# Patient Record
Sex: Female | Born: 1985 | Race: White | Hispanic: No | Marital: Single | Smoking: Current every day smoker
Health system: Southern US, Community
[De-identification: ages and names within clinical notes are randomized; demographics above are authoritative.]

## PROBLEM LIST (undated history)

## (undated) DIAGNOSIS — S42309A Unspecified fracture of shaft of humerus, unspecified arm, initial encounter for closed fracture: Secondary | ICD-10-CM

## (undated) DIAGNOSIS — R569 Unspecified convulsions: Secondary | ICD-10-CM

## (undated) DIAGNOSIS — F329 Major depressive disorder, single episode, unspecified: Secondary | ICD-10-CM

## (undated) DIAGNOSIS — F32A Depression, unspecified: Secondary | ICD-10-CM

## (undated) DIAGNOSIS — Z8489 Family history of other specified conditions: Secondary | ICD-10-CM

## (undated) DIAGNOSIS — O24419 Gestational diabetes mellitus in pregnancy, unspecified control: Secondary | ICD-10-CM

## (undated) HISTORY — PX: WISDOM TOOTH EXTRACTION: SHX21

## (undated) HISTORY — PX: SHOULDER SURGERY: SHX246

## (undated) HISTORY — DX: Gestational diabetes mellitus in pregnancy, unspecified control: O24.419

---
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
## 2008-10-05 ENCOUNTER — Emergency Department (HOSPITAL_COMMUNITY): Admission: EM | Admit: 2008-10-05 | Discharge: 2008-10-05 | Payer: Self-pay | Admitting: Family Medicine

## 2009-01-05 ENCOUNTER — Emergency Department (HOSPITAL_COMMUNITY): Admission: EM | Admit: 2009-01-05 | Discharge: 2009-01-05 | Payer: Self-pay | Admitting: Emergency Medicine

## 2009-07-14 ENCOUNTER — Inpatient Hospital Stay (HOSPITAL_COMMUNITY): Admission: AD | Admit: 2009-07-14 | Discharge: 2009-07-14 | Payer: Self-pay | Admitting: Family Medicine

## 2010-07-10 LAB — DIFFERENTIAL
Basophils Absolute: 0 10*3/uL (ref 0.0–0.1)
Basophils Relative: 0 % (ref 0–1)
Eosinophils Absolute: 0.3 10*3/uL (ref 0.0–0.7)
Lymphs Abs: 1.8 10*3/uL (ref 0.7–4.0)
Monocytes Absolute: 0.4 10*3/uL (ref 0.1–1.0)
Monocytes Relative: 5 % (ref 3–12)
Neutro Abs: 4.7 10*3/uL (ref 1.7–7.7)
Neutrophils Relative %: 65 % (ref 43–77)

## 2010-07-10 LAB — WET PREP, GENITAL: Yeast Wet Prep HPF POC: NONE SEEN

## 2010-07-10 LAB — URINALYSIS, ROUTINE W REFLEX MICROSCOPIC
Bilirubin Urine: NEGATIVE
Nitrite: NEGATIVE
pH: 8 (ref 5.0–8.0)

## 2010-07-10 LAB — CBC
MCHC: 32.9 g/dL (ref 30.0–36.0)
RBC: 5.08 MIL/uL (ref 3.87–5.11)

## 2010-07-24 LAB — POCT URINALYSIS DIP (DEVICE)
Nitrite: NEGATIVE
Specific Gravity, Urine: >=1.03 (ref 1.005–1.030)

## 2011-01-26 LAB — DIFFERENTIAL
Basophils Absolute: 0
Basophils Relative: 1
Eosinophils Absolute: 0.4
Lymphocytes Relative: 25
Neutrophils Relative %: 63

## 2011-01-26 LAB — CBC
RBC: 5.23 — ABNORMAL HIGH
WBC: 8.4

## 2011-01-26 LAB — POCT PREGNANCY, URINE: Preg Test, Ur: NEGATIVE

## 2011-01-26 LAB — POCT URINALYSIS DIP (DEVICE)
Glucose, UA: NEGATIVE
Ketones, ur: NEGATIVE
Operator id: 235561

## 2011-02-01 LAB — POCT RAPID STREP A: Streptococcus, Group A Screen (Direct): NEGATIVE

## 2011-02-01 LAB — POCT INFECTIOUS MONO SCREEN: Mono Screen: NEGATIVE

## 2011-05-23 ENCOUNTER — Emergency Department (HOSPITAL_COMMUNITY)
Admission: EM | Admit: 2011-05-23 | Discharge: 2011-05-23 | Disposition: A | Discharge status: Home / Self Care | Payer: Self-pay | Attending: Emergency Medicine | Admitting: Emergency Medicine

## 2011-05-23 ENCOUNTER — Encounter (HOSPITAL_COMMUNITY): Payer: Self-pay | Admitting: *Deleted

## 2011-05-23 DIAGNOSIS — M542 Cervicalgia: Secondary | ICD-10-CM | POA: Insufficient documentation

## 2011-05-23 DIAGNOSIS — H60399 Other infective otitis externa, unspecified ear: Secondary | ICD-10-CM | POA: Insufficient documentation

## 2011-05-23 DIAGNOSIS — H9209 Otalgia, unspecified ear: Secondary | ICD-10-CM | POA: Insufficient documentation

## 2011-05-23 DIAGNOSIS — H6000 Abscess of external ear, unspecified ear: Secondary | ICD-10-CM

## 2011-05-23 DIAGNOSIS — R6884 Jaw pain: Secondary | ICD-10-CM | POA: Insufficient documentation

## 2011-05-23 MED ORDER — HYDROMORPHONE HCL PF 1 MG/ML IJ SOLN
1.0000 mg | Freq: Once | INTRAMUSCULAR | Status: AC
  Administered 2011-05-23: 1 mg via INTRAMUSCULAR
  Filled 2011-05-23: qty 1

## 2011-05-23 MED ORDER — ONDANSETRON 4 MG PO TBDP
ORAL_TABLET | ORAL | Status: AC
  Administered 2011-05-23: 8 mg
  Filled 2011-05-23: qty 2

## 2011-05-23 MED ORDER — HYDROCODONE-ACETAMINOPHEN 5-325 MG PO TABS: 1.0000 | ORAL_TABLET | Freq: Four times a day (QID) | ORAL | Status: AC | PRN

## 2011-05-23 MED ORDER — DOXYCYCLINE HYCLATE 100 MG PO CAPS: 100.0000 mg | ORAL_CAPSULE | Freq: Two times a day (BID) | ORAL | Status: AC

## 2011-05-23 MED ORDER — DIAZEPAM 5 MG PO TABS
5.0000 mg | ORAL_TABLET | Freq: Once | ORAL | Status: AC
  Administered 2011-05-23: 5 mg via ORAL
  Filled 2011-05-23: qty 1

## 2011-05-23 NOTE — ED Provider Notes (Signed)
History     CSN: [id number]  Arrival date & time 05/23/11  0910   First MD Initiated Contact with Patient 05/23/11 [phone]      Chief Complaint  Patient presents with  . Otalgia    (Consider location/radiation/quality/duration/timing/severity/associated sxs/prior treatment) HPI Patient states that she has been having right ear pain for about 1 week. The pain is located on the posterior side of her ear lobe and has progressively gotten worse. She rates her pain "20/10". She states that it radiates into her jaw and neck on the right side. She has used ice packs and ibuprofen with little relief of pain. 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 She says that these have been there for years and never caused her any problems. She denies fevers, chills, sweats, and ear drainage. She denies tooth pain and nasal congestion. Patient is in tears during the interview and is using a heat pack. The patient currently takes no medications regularly. She is allergic to Depakote. History reviewed. No pertinent past medical history.  History reviewed. No pertinent past surgical history.  History reviewed. No pertinent family history.  History  Substance Use Topics  . Smoking status: Current Everyday Smoker  . Smokeless tobacco: Not on file  . Alcohol Use: Yes    OB History    Grav Para Term Preterm Abortions TAB SAB Ect Mult Living                  Review of Systems All pertinent positives and negatives reviewed in the history of present illness   Allergies  Depakote  Home Medications   Current Outpatient Rx  Name Route Sig Dispense Refill  . HYDROCOD POLST-CPM POLST ER 10-8 MG/5ML PO LQCR Oral Take 5 mLs by mouth every 12 (twelve) hours as needed. For cough    . IBUPROFEN 200 MG PO TABS Oral Take 800 mg by mouth every 8 (eight) hours as needed. For pain      BP 128/77  Pulse 74  Temp(Src) 98.1 F (36.7 C) (Oral)  Resp 17  SpO2  100%  Physical Exam  Constitutional: She appears well-developed and well-nourished. She appears distressed.  HENT:  Head: Normocephalic and atraumatic.  Left Ear: External ear normal.  Mouth/Throat: Oropharynx is clear and moist. No oropharyngeal exudate.       Rt. Ear: 1cm abcess located on posterior lobe of right ear; mild erythema to external ear/mastoid posteriorly; painful to palpation; TM nl.   Eyes: Conjunctivae and EOM are normal. Pupils are equal, round, and reactive to light.  Neck: Neck supple.  Lymphadenopathy:    She has no cervical adenopathy.  Skin: She is not diaphoretic.    ED Course  Procedures (including critical care time)   INCISION AND DRAINAGE Performed by: Carlyle Dolly Consent: Verbal consent obtained. Risks and benefits: risks, benefits and alternatives were discussed Type: abscess  Body area: R ear lobe posteriorly  Anesthesia: local infiltration  Local anesthetic: lidocaine 2% wo epinephrine  Anesthetic total: 5 ml  Complexity: complex Blunt dissection   Drainage: purulent  Drainage amount:large  Packing material: 1/4 in iodoform gauze  Patient tolerance: Patient tolerated the procedure well with no immediate complications.          MDM  The patient is advised to return here for any worsening in her condition and is asked to return here in 2 days for a recheck. Told to use warm compresses on  the area.        Carlyle Dolly, PA-C 05/23/11 1240

## 2011-05-23 NOTE — ED Notes (Signed)
Per PA, band aid applied to pt's ear.

## 2011-05-23 NOTE — ED Notes (Signed)
Pt reports 3 day hx of progressively worsening right ear swelling. Pt with redness and swelling to lower lobe of ear. Pt reports very painful. No draining noted.

## 2011-05-24 NOTE — ED Provider Notes (Signed)
Medical screening examination/treatment/procedure(s) were performed by non-physician practitioner and as supervising physician I was immediately available for consultation/collaboration.   Shelda Jakes, MD 05/24/11 1240

## 2011-05-25 ENCOUNTER — Emergency Department (HOSPITAL_COMMUNITY)
Admission: EM | Admit: 2011-05-25 | Discharge: 2011-05-25 | Disposition: A | Discharge status: Home / Self Care | Payer: Self-pay | Attending: Emergency Medicine | Admitting: Emergency Medicine

## 2011-05-25 ENCOUNTER — Encounter (HOSPITAL_COMMUNITY): Payer: Self-pay | Admitting: Emergency Medicine

## 2011-05-25 DIAGNOSIS — H60399 Other infective otitis externa, unspecified ear: Secondary | ICD-10-CM | POA: Insufficient documentation

## 2011-05-25 DIAGNOSIS — Z09 Encounter for follow-up examination after completed treatment for conditions other than malignant neoplasm: Secondary | ICD-10-CM | POA: Insufficient documentation

## 2011-05-25 DIAGNOSIS — Z4800 Encounter for change or removal of nonsurgical wound dressing: Secondary | ICD-10-CM | POA: Insufficient documentation

## 2011-05-25 DIAGNOSIS — H6001 Abscess of right external ear: Secondary | ICD-10-CM

## 2011-05-25 NOTE — ED Notes (Signed)
Abscess to Rt. Ear lobe continues to be red, swollen and dressing intact. Improvement noted and pain has improved

## 2011-05-25 NOTE — ED Provider Notes (Signed)
Medical screening examination/treatment/procedure(s) were performed by non-physician practitioner and as supervising physician I was immediately available for consultation/collaboration.   Loren Racer, MD 05/25/11 2002

## 2011-05-25 NOTE — ED Notes (Signed)
Pt here for recheck of abscess on right ear

## 2011-05-25 NOTE — ED Provider Notes (Signed)
History     CSN: [id number]  Arrival date & time 05/25/11  1342   First MD Initiated Contact with Patient 05/25/11 1421      Chief Complaint  Patient presents with  . Wound Check    (Consider location/radiation/quality/duration/timing/severity/associated sxs/prior treatment) HPI  26 year old female is in CDU for a wound recheck. Patient has an abscess to the right earlobe that was successfully behind the 2 days ago. Packing was put in place. Patient was discharge with pain medication and antibiotic. Patient states her pain has decreased but is still moderately tender. She denies fever. She denies seeing red streak, or change in her hearing.  History reviewed. No pertinent past medical history.  History reviewed. No pertinent past surgical history.  History reviewed. No pertinent family history.  History  Substance Use Topics  . Smoking status: Current Everyday Smoker  . Smokeless tobacco: Not on file  . Alcohol Use: Yes    OB History    Grav Para Term Preterm Abortions TAB SAB Ect Mult Living                  Review of Systems  All other systems reviewed and are negative.    Allergies  Depakote  Home Medications   Current Outpatient Rx  Name Route Sig Dispense Refill  . DOXYCYCLINE HYCLATE 100 MG PO CAPS Oral Take 1 capsule (100 mg total) by mouth 2 (two) times daily. 20 capsule 0  . HYDROCODONE-ACETAMINOPHEN 5-325 MG PO TABS Oral Take 1 tablet by mouth every 6 (six) hours as needed for pain. 15 tablet 0  . IBUPROFEN 200 MG PO TABS Oral Take 800 mg by mouth every 8 (eight) hours as needed. For pain      BP 116/73  Pulse 78  Temp 97.8 F (36.6 C)  Resp 18  SpO2 100%  Physical Exam  Constitutional: She appears well-developed and well-nourished. No distress.  HENT:  Head: Atraumatic.  Left Ear: External ear normal.       Right ear:  Tenderness and fluctuant to lower your lobe with packing in place. No erythema noted. No pre-or postauricular  lymphadenopathy. Right ear canal is unremarkable.  Eyes: Conjunctivae are normal.  Neck: Normal range of motion. Neck supple.  Pulmonary/Chest: Effort normal.  Neurological: She is alert.  Skin: Skin is warm.    ED Course  Procedures (including critical care time)  Labs Reviewed - No data to display No results found.   No diagnosis found.    MDM  Packing from right ear were removed. Moderate pustular exudates were noted. I proceeds to continue manually expelled the remainder of the pustular discharge. Abscess area was irrigated with normal saline. Patient tolerated the procedure well. Bacitracin applied and area was bandaged. Patient is recommended to continue antibiotic and pain medication. Wound care instruction given.        Fayrene Helper, PA-C 05/25/11 1437

## 2012-05-02 ENCOUNTER — Emergency Department (HOSPITAL_COMMUNITY): Admission: EM | Admit: 2012-05-02 | Discharge: 2012-05-02 | Payer: Self-pay | Source: Home / Self Care

## 2012-05-02 NOTE — ED Notes (Signed)
Called in all waiting areas - no response. 

## 2012-09-01 ENCOUNTER — Emergency Department (INDEPENDENT_AMBULATORY_CARE_PROVIDER_SITE_OTHER)
Admission: EM | Admit: 2012-09-01 | Discharge: 2012-09-01 | Disposition: A | Discharge status: Home / Self Care | Payer: Self-pay | Source: Home / Self Care | Attending: Family Medicine | Admitting: Family Medicine

## 2012-09-01 ENCOUNTER — Encounter (HOSPITAL_COMMUNITY): Payer: Self-pay | Admitting: Emergency Medicine

## 2012-09-01 DIAGNOSIS — L089 Local infection of the skin and subcutaneous tissue, unspecified: Secondary | ICD-10-CM

## 2012-09-01 DIAGNOSIS — L723 Sebaceous cyst: Secondary | ICD-10-CM

## 2012-09-01 LAB — POCT URINALYSIS DIP (DEVICE)
Glucose, UA: NEGATIVE mg/dL
Hgb urine dipstick: NEGATIVE
Nitrite: NEGATIVE
Urobilinogen, UA: 0.2 mg/dL (ref 0.0–1.0)

## 2012-09-01 LAB — POCT PREGNANCY, URINE: Preg Test, Ur: NEGATIVE

## 2012-09-01 LAB — GLUCOSE, CAPILLARY: Glucose-Capillary: 112 mg/dL — ABNORMAL HIGH (ref 70–99)

## 2012-09-01 MED ORDER — IBUPROFEN 600 MG PO TABS: 600.0000 mg | ORAL_TABLET | Freq: Three times a day (TID) | ORAL | Status: DC | PRN

## 2012-09-01 MED ORDER — DOXYCYCLINE HYCLATE 100 MG PO CAPS: 100.0000 mg | ORAL_CAPSULE | Freq: Two times a day (BID) | ORAL | Status: DC

## 2012-09-01 NOTE — ED Provider Notes (Signed)
History     CSN: [id number]  Arrival date & time 09/01/12  1223   First MD Initiated Contact with Patient 09/01/12 1319      Chief Complaint  Patient presents with  . Abscess  . Dizziness    (Consider location/radiation/quality/duration/timing/severity/associated sxs/prior treatment) HPI Comments: 27 year old nondiabetic female here with the following complaints: #1) a full swelling not in  the left side of her face anterior to her ear. She has felt a knot for several weeks she has been dropping ovary and became tender and swollen few days ago (about 3 days) 0 no spontaneous drainage. No fever or chills. No ear drainage.  #2) patient complaining of intermittent dizziness, urinary frequency and drinking more fluids than what is usual for her she understand these are symptoms of diabetes and she has strong family history of diabetes. She has never been diagnosed with diabetes in the past. Denies painful urination fever or chills. No nausea or vomiting. No pelvic pain. No vaginal discharge. Patient reports using condoms consistently for birth control.    History reviewed. No pertinent past medical history.  History reviewed. No pertinent past surgical history.  No family history on file.  History  Substance Use Topics  . Smoking status: Current Every Day Smoker  . Smokeless tobacco: Not on file  . Alcohol Use: Yes    OB History   Grav Para Term Preterm Abortions TAB SAB Ect Mult Living                  Review of Systems  Constitutional: Negative for fever and chills.  HENT: Negative for hearing loss, ear pain, facial swelling and ear discharge.   Eyes: Negative for visual disturbance.  Respiratory: Negative for shortness of breath.   Endocrine: Positive for polydipsia and polyuria. Negative for cold intolerance and heat intolerance.  Genitourinary: Negative for dysuria and flank pain.  Skin:       As per HPI  Neurological: Negative for facial asymmetry and headaches.   Psychiatric/Behavioral: The patient is nervous/anxious.   All other systems reviewed and are negative.    Allergies  Divalproex sodium  Home Medications   Current Outpatient Rx  Name  Route  Sig  Dispense  Refill  . doxycycline (VIBRAMYCIN) 100 MG capsule   Oral   Take 1 capsule (100 mg total) by mouth 2 (two) times daily.   20 capsule   0   . ibuprofen (ADVIL,MOTRIN) 600 MG tablet   Oral   Take 1 tablet (600 mg total) by mouth every 8 (eight) hours as needed for pain.   30 tablet   0     BP 139/95  Pulse 80  Temp(Src) 98.7 F (37.1 C) (Oral)  Resp 16  SpO2 100%  LMP 08/24/2012  Physical Exam  Nursing note and vitals reviewed. Constitutional: She is oriented to person, place, and time. She appears well-developed and well-nourished. No distress.  HENT:  Head: Normocephalic and atraumatic.  Right Ear: External ear normal.  Left Ear: External ear normal.  Mouth/Throat: Oropharynx is clear and moist. No oropharyngeal exudate.  Neck: Neck supple.  Cardiovascular: Normal heart sounds.   Pulmonary/Chest: Breath sounds normal.  Lymphadenopathy:    She has no cervical adenopathy.  Neurological: She is oriented to person, place, and time.  Skin: She is not diaphoretic.  Face: indurated lesion about 1x2 cm with central fluctuation and mild erythema on  Top located anterior to left ear tragus. No cellulitis associated.     ED  Course  INCISION AND DRAINAGE Performed by: Sharin Grave Authorized by: Sharin Grave Risks and benefits: risks, benefits and alternatives were discussed Consent given by: patient Patient understanding: patient states understanding of the procedure being performed Patient consent: the patient's understanding of the procedure matches consent given Type: abscess (infected epidermacys) Body area: head/neck Location details: face Local anesthetic: lidocaine 1% with epinephrine Anesthetic total: 1 ml Patient sedated: no Scalpel size:  11 Incision type: single straight Complexity: simple Drainage: purulent and serous Drainage amount: moderate Patient tolerance: Patient tolerated the procedure well with no immediate complications.   (including critical care time)  Labs Reviewed  GLUCOSE, CAPILLARY - Abnormal; Notable for the following:    Glucose-Capillary 112 (*)    All other components within normal limits  CULTURE, ROUTINE-ABSCESS  POCT URINALYSIS DIP (DEVICE)  POCT PREGNANCY, URINE   No results found.   1. Infected epithelial inclusion cyst       MDM  Epidermal cyst infected. Status post incision and drainage today. Samples sent for culture Treated with doxycycline and ibuprofen. CBG random 112. No glucosuria normal urinalysis and negative pregnancy test. Impress anxiety component. Supportive care and red flags that should prompt her return to medical attention discussed with patient and provided in writing.        Sharin Grave, MD 09/01/12 1455

## 2012-09-01 NOTE — ED Notes (Signed)
Pt c/o abscess on left ear onset 3 weeks; denies drainage Denies: f/v/d Also c/o light headedness onset 2 months; sx include: urinary frequency, freq thrist Family hx of DM  She is alert and oriented w/no signs of acute distress.

## 2012-09-04 LAB — CULTURE, ROUTINE-ABSCESS: Gram Stain: NONE SEEN

## 2013-02-16 ENCOUNTER — Emergency Department (HOSPITAL_COMMUNITY): Payer: Worker's Compensation

## 2013-02-16 ENCOUNTER — Emergency Department (HOSPITAL_COMMUNITY)
Admission: EM | Admit: 2013-02-16 | Discharge: 2013-02-16 | Disposition: A | Discharge status: Home / Self Care | Payer: Worker's Compensation | Attending: Emergency Medicine | Admitting: Emergency Medicine

## 2013-02-16 ENCOUNTER — Encounter (HOSPITAL_COMMUNITY): Payer: Self-pay | Admitting: Emergency Medicine

## 2013-02-16 DIAGNOSIS — R112 Nausea with vomiting, unspecified: Secondary | ICD-10-CM | POA: Insufficient documentation

## 2013-02-16 DIAGNOSIS — W3189XA Contact with other specified machinery, initial encounter: Secondary | ICD-10-CM | POA: Insufficient documentation

## 2013-02-16 DIAGNOSIS — S42309A Unspecified fracture of shaft of humerus, unspecified arm, initial encounter for closed fracture: Secondary | ICD-10-CM | POA: Insufficient documentation

## 2013-02-16 DIAGNOSIS — Y9289 Other specified places as the place of occurrence of the external cause: Secondary | ICD-10-CM | POA: Insufficient documentation

## 2013-02-16 DIAGNOSIS — S0993XA Unspecified injury of face, initial encounter: Secondary | ICD-10-CM | POA: Insufficient documentation

## 2013-02-16 DIAGNOSIS — Y9389 Activity, other specified: Secondary | ICD-10-CM | POA: Insufficient documentation

## 2013-02-16 DIAGNOSIS — S42322A Displaced transverse fracture of shaft of humerus, left arm, initial encounter for closed fracture: Secondary | ICD-10-CM

## 2013-02-16 DIAGNOSIS — IMO0002 Reserved for concepts with insufficient information to code with codable children: Secondary | ICD-10-CM | POA: Insufficient documentation

## 2013-02-16 DIAGNOSIS — S0003XA Contusion of scalp, initial encounter: Secondary | ICD-10-CM | POA: Insufficient documentation

## 2013-02-16 DIAGNOSIS — Y990 Civilian activity done for income or pay: Secondary | ICD-10-CM | POA: Insufficient documentation

## 2013-02-16 DIAGNOSIS — F17200 Nicotine dependence, unspecified, uncomplicated: Secondary | ICD-10-CM | POA: Insufficient documentation

## 2013-02-16 MED ORDER — OXYCODONE-ACETAMINOPHEN 5-325 MG PO TABS: ORAL_TABLET | ORAL | Status: DC

## 2013-02-16 MED ORDER — HYDROMORPHONE HCL PF 1 MG/ML IJ SOLN
0.5000 mg | Freq: Once | INTRAMUSCULAR | Status: AC
  Administered 2013-02-16: 0.5 mg via INTRAVENOUS
  Filled 2013-02-16: qty 1

## 2013-02-16 MED ORDER — HYDROMORPHONE HCL PF 1 MG/ML IJ SOLN
1.0000 mg | Freq: Once | INTRAMUSCULAR | Status: AC
  Administered 2013-02-16: 1 mg via INTRAVENOUS
  Filled 2013-02-16: qty 1

## 2013-02-16 MED ORDER — MORPHINE SULFATE 4 MG/ML IJ SOLN
4.0000 mg | Freq: Once | INTRAMUSCULAR | Status: AC
  Administered 2013-02-16: 4 mg via INTRAVENOUS
  Filled 2013-02-16: qty 1

## 2013-02-16 MED ORDER — ONDANSETRON HCL 4 MG/2ML IJ SOLN
4.0000 mg | Freq: Once | INTRAMUSCULAR | Status: AC
  Administered 2013-02-16: 4 mg via INTRAVENOUS
  Filled 2013-02-16: qty 2

## 2013-02-16 MED ORDER — PROMETHAZINE HCL 25 MG PO TABS: 25.0000 mg | ORAL_TABLET | Freq: Four times a day (QID) | ORAL | Status: DC | PRN

## 2013-02-16 NOTE — ED Provider Notes (Signed)
CSN: [id number]     Arrival date & time 02/16/13  4098 History   First MD Initiated Contact with Patient 02/16/13 1010     Chief Complaint  Patient presents with  . Shoulder Injury  . Neck Injury  . Arm Injury   (Consider location/radiation/quality/duration/timing/severity/associated sxs/prior Treatment) HPI Pt is a 27yo female sent to ED from [facility] after being evaluated for a work related injury.  Pt states she was working with a machine earlier today that [place] used to wind up aluminum onto large pipes when her glove became caught, causing her arm to become stuck in the machine, twisting it around, causing the pt's body to wrap around the maching and land upside down by her neck, lodged between the machine.  Mother saw incident, states pt did not lose consciousness.  Pt c/o severe, 10/10, constant left arm pain that is aching, sharp and throbbing, radiating up and down her left arm, associated with left hand numbness and pain.  Pain is so severe she does not want to move her arm.  Pt was given nubain 10mg  IM at UC and placed in a sling.  Pain medication made pt sleepy and caused her to vomit.  Pt also c/o neck and lower back pain. Denies numbness and tingling in groin or legs. Able to ambulate w/o difficulty. Denies headache, change in vision or balance. Denies chest or abdominal pain. Denies difficulty breathing.  History reviewed. No pertinent past medical history. History reviewed. No pertinent past surgical history. No family history on file. History  Substance Use Topics  . Smoking status: Current Every Day Smoker  . Smokeless tobacco: Not on file  . Alcohol Use: Yes   OB History   Grav Para Term Preterm Abortions TAB SAB Ect Mult Living                 Review of Systems  Gastrointestinal: Positive for nausea and vomiting. Negative for abdominal pain.  Musculoskeletal: Positive for back pain, myalgias and neck pain. Negative for gait problem and neck stiffness.  Neurological:  Positive for weakness ( left hand) and numbness ( left hand). Negative for dizziness, light-headedness and headaches.  All other systems reviewed and are negative.    Allergies  Divalproex sodium  Home Medications   Current Outpatient Rx  Name  Route  Sig  Dispense  Refill  . ALPRAZolam (XANAX) 0.25 MG tablet   Oral   Take 0.25 mg by mouth at bedtime as needed for sleep.         Marland Kitchen oxyCODONE-acetaminophen (PERCOCET/ROXICET) 5-325 MG per tablet      Take 1-2 pills every 4-6 hours as needed for pain.   15 tablet   0   . promethazine (PHENERGAN) 25 MG tablet   Oral   Take 1 tablet (25 mg total) by mouth every 6 (six) hours as needed for nausea.   12 tablet   0    BP 111/72  Pulse 66  Temp(Src) 98.6 F (37 C) (Oral)  Resp 16  Ht 4\' 11"  (1.499 m)  Wt 150 lb (68.04 kg)  BMI 30.28 kg/m2  SpO2 97%  LMP 02/13/2013 Physical Exam  Nursing note and vitals reviewed. Constitutional: She is oriented to person, place, and time. She appears well-developed and well-nourished. No distress.  HENT:  Head: Normocephalic.    Nose: No sinus tenderness or nasal deformity.  Mouth/Throat: Uvula is midline, oropharynx is clear and moist and mucous membranes are normal. No trismus in the jaw.  No oropharyngeal exudate.  Mild ecchymosis over left mandible. TTP. Able to open and close mouth, mild pain. No deformity. No crepitus at TMJ.  Eyes: Conjunctivae and EOM are normal. Pupils are equal, round, and reactive to light. Right eye exhibits no discharge. Left eye exhibits no discharge. No scleral icterus.  Neck: Normal range of motion. Neck supple.  No midline bone tenderness, no crepitus or step-offs. Tender along cervical paraspinal muscles. FROM with mild tenderness.  Cardiovascular: Normal rate, regular rhythm and normal heart sounds.   Pulmonary/Chest: Effort normal and breath sounds normal. No respiratory distress. She has no wheezes. She has no rales. She exhibits no tenderness.  No  chest wall tenderness, ecchymosis or deformity.  Abdominal: Soft. Bowel sounds are normal. She exhibits no distension and no mass. There is no tenderness. There is no rebound and no guarding.  Soft, non-distended, non-tender.  Musculoskeletal: Normal range of motion. She exhibits edema and tenderness.       Left upper arm: She exhibits tenderness, bony tenderness, swelling and deformity. She exhibits no laceration.  Left arm: moderate edema with mild deformity over left upper arm. Exquisite tenderness. No ecchymosis or wounds.  Decreased ROM at shoulder and elbow due to severe pain.  No bony tenderness over left elbow.  Unable to make a complete fist of left hand due to pain and numbness. Radial pulse: 2+ Cap refill <3sec.  Neurological: She is alert and oriented to person, place, and time. She has normal strength. No cranial nerve deficit or sensory deficit. She displays a negative Romberg sign. GCS eye subscore is 4. GCS verbal subscore is 5. GCS motor subscore is 6.  Skin: Skin is warm and dry. She is not diaphoretic.    ED Course  Procedures (including critical care time) Labs Review Labs Reviewed - No data to display Imaging Review Dg Wrist Complete Left  02/16/2013   CLINICAL DATA:  Injury.  EXAM: LEFT WRIST - COMPLETE 3+ VIEW  COMPARISON:  None.  FINDINGS: There is no evidence of fracture or dislocation. There is no evidence of arthropathy or other focal bone abnormality. Soft tissues are unremarkable.  IMPRESSION: Negative.   Electronically Signed   By: Elberta Fortis M.D.   On: 02/16/2013 11:38   Dg Shoulder Left  02/16/2013   CLINICAL DATA:  Injury.  EXAM: LEFT SHOULDER - 2+ VIEW  COMPARISON:  None.  FINDINGS: Exam demonstrates a displaced transverse fracture of the mid left humeral diaphysis with anterior medial angulation of the distal fragment. Remainder of the exam is unremarkable.  IMPRESSION: Displaced transverse fracture of the left mid humeral diaphysis.   Electronically Signed    By: Elberta Fortis M.D.   On: 02/16/2013 11:35   Dg Humerus Left  02/16/2013   CLINICAL DATA:  Injury.  EXAM: LEFT HUMERUS - 2+ VIEW  COMPARISON:  None.  FINDINGS: Exam demonstrates a displaced transverse fracture through the left mid humeral diaphysis. There is a posterior medial angulation of the distal fragment.  IMPRESSION: Displaced transverse fracture of the left mid humeral diaphysis.   Electronically Signed   By: Elberta Fortis M.D.   On: 02/16/2013 11:36    EKG Interpretation   None       MDM   1. Closed displaced transverse fracture of shaft of humerus, left, initial encounter    Pt presented with neck, back, and arm pain after becoming caught in a machine while at work.  No LOC. Denies headache, n/v.  Plain films show displaced transverse  fx of left mid-humeral diaphysis.  No other fractures.    Will consult orthopedics as pt has a closed displaced transverse fx of left mid humeral diaphysis.  Pt c/o left hand numbness and numbness along radial aspect of forearm. Pt is able to move thumb, but hesitant. Radial pulse: 2+  Consulted with Dr. Luiz Blare, advised pt be placed in a coaptation splint and provided pain medication. Call office tomorrow morning for follow up.  All labs/imaging/findings discussed with patient. All questions answered and concerns addressed. Will discharge pt home and have pt f/u with Dr. Luiz Blare, [facility] tomorrow, info provided. Rx: phenergan and percocet. Return precautions given. Pt verbalized understanding and agreement with tx plan. Vitals: unremarkable. Discharged in stable condition.    Discussed pt with Dr. Bernette Mayers during ED encounter and agrees with plan.   Junius Finner, PA-C 02/16/13 1600

## 2013-02-16 NOTE — ED Notes (Signed)
nubain 10mg  IM given at [facility]

## 2013-02-16 NOTE — ED Notes (Signed)
Pt was at work and pulled into a machine wrapping her lt arm around her back and flipped her upside down by her neck was lodged in the machine. Pt went to UC received unknown pain shot, pt sleepy acting and vomiting, c/o lt shoulder/arm/wrist/neck/lower back pain, numbness to lt fingers and upper arm. Pt denies LOC, pt has a sling from UC

## 2013-02-17 NOTE — ED Provider Notes (Signed)
Medical screening examination/treatment/procedure(s) were conducted as a shared visit with non-physician practitioner(s) and myself.  I personally evaluated the patient during the encounter.  EKG Interpretation   None       Pt with work related injury has L mid-humerus fx. Numbness from elbow down. Splint and outpatient followup per Ortho.   Charles B. Bernette Mayers, MD 02/17/13 0700

## 2013-02-18 ENCOUNTER — Ambulatory Visit (HOSPITAL_COMMUNITY): Payer: Worker's Compensation

## 2013-02-18 ENCOUNTER — Ambulatory Visit (HOSPITAL_BASED_OUTPATIENT_CLINIC_OR_DEPARTMENT_OTHER)
Admission: RE | Admit: 2013-02-18 | Discharge: 2013-02-19 | Disposition: A | Discharge status: Home / Self Care | Payer: Worker's Compensation | Source: Ambulatory Visit | Attending: Orthopedic Surgery | Admitting: Orthopedic Surgery

## 2013-02-18 ENCOUNTER — Ambulatory Visit (HOSPITAL_BASED_OUTPATIENT_CLINIC_OR_DEPARTMENT_OTHER): Payer: Worker's Compensation | Admitting: Certified Registered Nurse Anesthetist

## 2013-02-18 ENCOUNTER — Encounter (HOSPITAL_BASED_OUTPATIENT_CLINIC_OR_DEPARTMENT_OTHER)
Admission: RE | Disposition: A | Discharge status: Home / Self Care | Payer: Self-pay | Source: Ambulatory Visit | Attending: Orthopedic Surgery

## 2013-02-18 ENCOUNTER — Encounter (HOSPITAL_BASED_OUTPATIENT_CLINIC_OR_DEPARTMENT_OTHER): Payer: Self-pay | Admitting: *Deleted

## 2013-02-18 ENCOUNTER — Encounter (HOSPITAL_BASED_OUTPATIENT_CLINIC_OR_DEPARTMENT_OTHER): Payer: Worker's Compensation | Admitting: Certified Registered Nurse Anesthetist

## 2013-02-18 DIAGNOSIS — S42309A Unspecified fracture of shaft of humerus, unspecified arm, initial encounter for closed fracture: Secondary | ICD-10-CM | POA: Insufficient documentation

## 2013-02-18 DIAGNOSIS — F17200 Nicotine dependence, unspecified, uncomplicated: Secondary | ICD-10-CM | POA: Insufficient documentation

## 2013-02-18 DIAGNOSIS — S42302A Unspecified fracture of shaft of humerus, left arm, initial encounter for closed fracture: Secondary | ICD-10-CM

## 2013-02-18 DIAGNOSIS — Y9269 Other specified industrial and construction area as the place of occurrence of the external cause: Secondary | ICD-10-CM | POA: Insufficient documentation

## 2013-02-18 DIAGNOSIS — W3189XA Contact with other specified machinery, initial encounter: Secondary | ICD-10-CM | POA: Insufficient documentation

## 2013-02-18 DIAGNOSIS — S43429A Sprain of unspecified rotator cuff capsule, initial encounter: Secondary | ICD-10-CM | POA: Insufficient documentation

## 2013-02-18 DIAGNOSIS — Y990 Civilian activity done for income or pay: Secondary | ICD-10-CM | POA: Insufficient documentation

## 2013-02-18 HISTORY — DX: Unspecified fracture of shaft of humerus, unspecified arm, initial encounter for closed fracture: S42.309A

## 2013-02-18 HISTORY — DX: Major depressive disorder, single episode, unspecified: F32.9

## 2013-02-18 HISTORY — DX: Depression, unspecified: F32.A

## 2013-02-18 HISTORY — PX: HUMERUS IM NAIL: SHX1769

## 2013-02-18 LAB — POCT HEMOGLOBIN-HEMACUE: Hemoglobin: 14.7 g/dL (ref 12.0–15.0)

## 2013-02-18 SURGERY — INSERTION, INTRAMEDULLARY ROD, HUMERUS
Anesthesia: Regional | Site: Arm Upper | Laterality: Left | Wound class: Clean

## 2013-02-18 MED ORDER — CEFAZOLIN SODIUM-DEXTROSE 2-3 GM-% IV SOLR
INTRAVENOUS | Status: AC
  Filled 2013-02-18: qty 50

## 2013-02-18 MED ORDER — SODIUM CHLORIDE 0.9 % IJ SOLN
INTRAMUSCULAR | Status: AC
  Filled 2013-02-18: qty 10

## 2013-02-18 MED ORDER — ONDANSETRON HCL 4 MG/2ML IJ SOLN: 4.0000 mg | Freq: Four times a day (QID) | INTRAMUSCULAR | Status: DC | PRN

## 2013-02-18 MED ORDER — MIDAZOLAM HCL 5 MG/5ML IJ SOLN
INTRAMUSCULAR | Status: DC | PRN
  Administered 2013-02-18: 2 mg via INTRAVENOUS

## 2013-02-18 MED ORDER — CEFAZOLIN SODIUM 1-5 GM-% IV SOLN
1.0000 g | Freq: Four times a day (QID) | INTRAVENOUS | Status: DC
  Administered 2013-02-18 – 2013-02-19 (×2): 1 g via INTRAVENOUS

## 2013-02-18 MED ORDER — HYDROMORPHONE HCL PF 1 MG/ML IJ SOLN
0.2500 mg | INTRAMUSCULAR | Status: DC | PRN
  Administered 2013-02-18 (×4): 0.5 mg via INTRAVENOUS

## 2013-02-18 MED ORDER — FENTANYL CITRATE 0.05 MG/ML IJ SOLN
INTRAMUSCULAR | Status: DC | PRN
  Administered 2013-02-18 (×6): 50 ug via INTRAVENOUS

## 2013-02-18 MED ORDER — LIDOCAINE HCL (CARDIAC) 20 MG/ML IV SOLN
INTRAVENOUS | Status: DC | PRN
  Administered 2013-02-18: 60 mg via INTRAVENOUS

## 2013-02-18 MED ORDER — METHOCARBAMOL 500 MG PO TABS: 500.0000 mg | ORAL_TABLET | Freq: Four times a day (QID) | ORAL | Status: DC | PRN

## 2013-02-18 MED ORDER — SUCCINYLCHOLINE CHLORIDE 20 MG/ML IJ SOLN
INTRAMUSCULAR | Status: DC | PRN
  Administered 2013-02-18: 100 mg via INTRAVENOUS

## 2013-02-18 MED ORDER — PROMETHAZINE HCL 25 MG/ML IJ SOLN: 12.5000 mg | Freq: Four times a day (QID) | INTRAMUSCULAR | Status: DC | PRN

## 2013-02-18 MED ORDER — METOCLOPRAMIDE HCL 5 MG/ML IJ SOLN
INTRAMUSCULAR | Status: AC
  Filled 2013-02-18: qty 2

## 2013-02-18 MED ORDER — PROMETHAZINE HCL 25 MG/ML IJ SOLN
6.2500 mg | INTRAMUSCULAR | Status: AC | PRN
  Administered 2013-02-18: 12.5 mg via INTRAVENOUS
  Administered 2013-02-18: 6.25 mg via INTRAVENOUS

## 2013-02-18 MED ORDER — MIDAZOLAM HCL 2 MG/2ML IJ SOLN
INTRAMUSCULAR | Status: AC
  Filled 2013-02-18: qty 2

## 2013-02-18 MED ORDER — LACTATED RINGERS IV SOLN
INTRAVENOUS | Status: DC
  Administered 2013-02-18 (×2): via INTRAVENOUS

## 2013-02-18 MED ORDER — SCOPOLAMINE 1 MG/3DAYS TD PT72
MEDICATED_PATCH | TRANSDERMAL | Status: AC
  Filled 2013-02-18: qty 1

## 2013-02-18 MED ORDER — ONDANSETRON HCL 4 MG/2ML IJ SOLN
INTRAMUSCULAR | Status: DC | PRN
  Administered 2013-02-18: 4 mg via INTRAVENOUS

## 2013-02-18 MED ORDER — DEXAMETHASONE SODIUM PHOSPHATE 4 MG/ML IJ SOLN
INTRAMUSCULAR | Status: DC | PRN
  Administered 2013-02-18: 10 mg via INTRAVENOUS

## 2013-02-18 MED ORDER — FENTANYL CITRATE 0.05 MG/ML IJ SOLN
INTRAMUSCULAR | Status: AC
  Filled 2013-02-18: qty 4

## 2013-02-18 MED ORDER — BUPIVACAINE HCL 0.5 % IJ SOLN
INTRAMUSCULAR | Status: DC | PRN
  Administered 2013-02-18: 20 mL

## 2013-02-18 MED ORDER — ALPRAZOLAM 0.25 MG PO TABS: 0.2500 mg | ORAL_TABLET | Freq: Every evening | ORAL | Status: DC | PRN

## 2013-02-18 MED ORDER — KETAMINE HCL 100 MG/ML IJ SOLN
INTRAMUSCULAR | Status: DC | PRN
  Administered 2013-02-18: 20 mg via INTRAVENOUS

## 2013-02-18 MED ORDER — PROPOFOL 10 MG/ML IV BOLUS
INTRAVENOUS | Status: DC | PRN
  Administered 2013-02-18: 200 mg via INTRAVENOUS

## 2013-02-18 MED ORDER — HYDROMORPHONE HCL PF 1 MG/ML IJ SOLN
INTRAMUSCULAR | Status: AC
  Filled 2013-02-18: qty 1

## 2013-02-18 MED ORDER — CEFAZOLIN SODIUM 1-5 GM-% IV SOLN
INTRAVENOUS | Status: AC
  Filled 2013-02-18: qty 50

## 2013-02-18 MED ORDER — PROMETHAZINE HCL 25 MG PO TABS
25.0000 mg | ORAL_TABLET | Freq: Four times a day (QID) | ORAL | Status: DC | PRN
  Administered 2013-02-19 (×2): 25 mg via ORAL

## 2013-02-18 MED ORDER — PROMETHAZINE HCL 25 MG/ML IJ SOLN
INTRAMUSCULAR | Status: AC
  Filled 2013-02-18: qty 1

## 2013-02-18 MED ORDER — MIDAZOLAM HCL 2 MG/2ML IJ SOLN: 1.0000 mg | INTRAMUSCULAR | Status: DC | PRN

## 2013-02-18 MED ORDER — KETAMINE HCL 100 MG/ML IJ SOLN
INTRAMUSCULAR | Status: AC
  Filled 2013-02-18: qty 1

## 2013-02-18 MED ORDER — OXYCODONE HCL 5 MG PO TABS: 5.0000 mg | ORAL_TABLET | Freq: Once | ORAL | Status: AC | PRN

## 2013-02-18 MED ORDER — SCOPOLAMINE 1 MG/3DAYS TD PT72
MEDICATED_PATCH | TRANSDERMAL | Status: DC | PRN
  Administered 2013-02-18: 1 via TRANSDERMAL

## 2013-02-18 MED ORDER — OXYCODONE HCL 5 MG/5ML PO SOLN: 5.0000 mg | Freq: Once | ORAL | Status: AC | PRN

## 2013-02-18 MED ORDER — METHOCARBAMOL 100 MG/ML IJ SOLN: 500.0000 mg | Freq: Four times a day (QID) | INTRAVENOUS | Status: DC | PRN

## 2013-02-18 MED ORDER — CEFAZOLIN SODIUM-DEXTROSE 2-3 GM-% IV SOLR
INTRAVENOUS | Status: DC | PRN
  Administered 2013-02-18: 2 g via INTRAVENOUS

## 2013-02-18 MED ORDER — FENTANYL CITRATE 0.05 MG/ML IJ SOLN
INTRAMUSCULAR | Status: AC
  Filled 2013-02-18: qty 2

## 2013-02-18 MED ORDER — ONDANSETRON HCL 4 MG/2ML IJ SOLN
INTRAMUSCULAR | Status: AC
  Filled 2013-02-18: qty 2

## 2013-02-18 MED ORDER — SODIUM CHLORIDE 0.9 % IV SOLN
INTRAVENOUS | Status: DC
  Administered 2013-02-18: 18:00:00 via INTRAVENOUS

## 2013-02-18 MED ORDER — OXYCODONE-ACETAMINOPHEN 5-325 MG PO TABS: 1.0000 | ORAL_TABLET | Freq: Four times a day (QID) | ORAL | Status: DC | PRN

## 2013-02-18 MED ORDER — METOCLOPRAMIDE HCL 5 MG/ML IJ SOLN
10.0000 mg | Freq: Four times a day (QID) | INTRAMUSCULAR | Status: DC | PRN
  Administered 2013-02-18: 10 mg via INTRAVENOUS

## 2013-02-18 MED ORDER — ONDANSETRON HCL 4 MG PO TABS: 4.0000 mg | ORAL_TABLET | Freq: Four times a day (QID) | ORAL | Status: DC | PRN

## 2013-02-18 MED ORDER — FENTANYL CITRATE 0.05 MG/ML IJ SOLN
50.0000 ug | INTRAMUSCULAR | Status: DC | PRN
  Administered 2013-02-18: 50 ug via INTRAVENOUS

## 2013-02-18 MED ORDER — HYDROMORPHONE HCL PF 1 MG/ML IJ SOLN: 1.0000 mg | INTRAMUSCULAR | Status: DC | PRN

## 2013-02-18 MED ORDER — OXYCODONE-ACETAMINOPHEN 5-325 MG PO TABS
1.0000 | ORAL_TABLET | ORAL | Status: DC | PRN
  Administered 2013-02-19 (×2): 2 via ORAL

## 2013-02-18 MED ORDER — FENTANYL CITRATE 0.05 MG/ML IJ SOLN
INTRAMUSCULAR | Status: AC
  Filled 2013-02-18: qty 6

## 2013-02-18 MED ORDER — PROPOFOL 10 MG/ML IV BOLUS
INTRAVENOUS | Status: AC
  Filled 2013-02-18: qty 20

## 2013-02-18 SURGICAL SUPPLY — 66 items
APL SKNCLS STERI-STRIP NONHPOA (GAUZE/BANDAGES/DRESSINGS) ×1
BANDAGE ELASTIC 3 VELCRO ST LF (GAUZE/BANDAGES/DRESSINGS) ×1 IMPLANT
BANDAGE ELASTIC 4 VELCRO ST LF (GAUZE/BANDAGES/DRESSINGS) ×1 IMPLANT
BANDAGE GAUZE ELAST BULKY 4 IN (GAUZE/BANDAGES/DRESSINGS) ×2 IMPLANT
BENZOIN TINCTURE PRP APPL 2/3 (GAUZE/BANDAGES/DRESSINGS) ×2 IMPLANT
BIT DRILL 2.9 SHORT NS (BIT) ×1
BIT DRILL 2.9MM SHORT NS (BIT) IMPLANT
BIT DRILL 3.8 (BIT) ×2
BIT DRILL 3.8XNS DISP GRN (BIT) IMPLANT
BIT DRL 3.8XNS DISP GRN (BIT) ×1
BLADE SURG 15 STRL LF DISP TIS (BLADE) ×1 IMPLANT
BLADE SURG 15 STRL SS (BLADE) ×2
CANISTER SUCT 1200ML W/VALVE (MISCELLANEOUS) ×2 IMPLANT
CAP END (Cap) IMPLANT
DECANTER SPIKE VIAL GLASS SM (MISCELLANEOUS) ×2 IMPLANT
DRAPE C-ARM 42X72 X-RAY (DRAPES) ×3 IMPLANT
DRAPE INCISE IOBAN 66X45 STRL (DRAPES) ×2 IMPLANT
DRAPE OEC MINIVIEW 54X84 (DRAPES) ×1 IMPLANT
DRAPE SURG 17X23 STRL (DRAPES) ×2 IMPLANT
DRAPE U-SHAPE 47X51 STRL (DRAPES) ×2 IMPLANT
DRAPE U-SHAPE 76X120 STRL (DRAPES) ×4 IMPLANT
DRILL BIT 2.9MM SHORT NS (BIT) ×2
DURAPREP 26ML APPLICATOR (WOUND CARE) ×2 IMPLANT
ELECT REM PT RETURN 9FT ADLT (ELECTROSURGICAL) ×2
ELECTRODE REM PT RTRN 9FT ADLT (ELECTROSURGICAL) ×1 IMPLANT
ENDCAP (Cap) ×1 IMPLANT
GLOVE BIO SURGEON STRL SZ 6.5 (GLOVE) ×1 IMPLANT
GLOVE BIOGEL PI IND STRL 7.0 (GLOVE) IMPLANT
GLOVE BIOGEL PI IND STRL 8 (GLOVE) ×2 IMPLANT
GLOVE BIOGEL PI INDICATOR 7.0 (GLOVE) ×2
GLOVE BIOGEL PI INDICATOR 8 (GLOVE) ×2
GLOVE ECLIPSE 6.5 STRL STRAW (GLOVE) ×1 IMPLANT
GLOVE ECLIPSE 7.5 STRL STRAW (GLOVE) ×4 IMPLANT
GOWN BRE IMP PREV XXLGXLNG (GOWN DISPOSABLE) ×2 IMPLANT
GOWN PREVENTION PLUS XLARGE (GOWN DISPOSABLE) ×3 IMPLANT
GOWN PREVENTION PLUS XXLARGE (GOWN DISPOSABLE) ×2 IMPLANT
GUIDEWIRE BALL NOSE 2.0MM (WIRE) ×1 IMPLANT
GUIDEWIRE HUMERAL 2.2MMX711MM (WIRE) ×1 IMPLANT
NAIL HUMERAL 8X220MM (Nail) ×1 IMPLANT
NS IRRIG 1000ML POUR BTL (IV SOLUTION) ×2 IMPLANT
PACK ARTHROSCOPY DSU (CUSTOM PROCEDURE TRAY) ×2 IMPLANT
PACK BASIN DAY SURGERY FS (CUSTOM PROCEDURE TRAY) ×2 IMPLANT
PENCIL BUTTON HOLSTER BLD 10FT (ELECTRODE) ×2 IMPLANT
PIN GUIDE ACE (PIN) ×1 IMPLANT
SCREW ACECAP 42MM (Screw) ×1 IMPLANT
SCREW ACECAP 44MM (Screw) ×1 IMPLANT
SCREW CORTICAL 3.5X24MM (Screw) ×1 IMPLANT
SCREWDRIVER HEX TIP 3.5MM (MISCELLANEOUS) ×1 IMPLANT
SLING ARM FOAM STRAP MED (SOFTGOODS) ×2 IMPLANT
SPONGE GAUZE 4X4 12PLY (GAUZE/BANDAGES/DRESSINGS) ×2 IMPLANT
SPONGE LAP 4X18 X RAY DECT (DISPOSABLE) ×2 IMPLANT
STRIP CLOSURE SKIN 1/2X4 (GAUZE/BANDAGES/DRESSINGS) ×1 IMPLANT
SUT ETHIBOND 2 OS 4 DA (SUTURE) ×1 IMPLANT
SUT ETHILON 3 0 FSL (SUTURE) IMPLANT
SUT FIBERWIRE #2 38 T-5 BLUE (SUTURE)
SUT MNCRL AB 3-0 PS2 18 (SUTURE) ×2 IMPLANT
SUT VIC AB 0 CT1 18XCR BRD 8 (SUTURE) IMPLANT
SUT VIC AB 0 CT1 8-18 (SUTURE)
SUT VIC AB 2-0 CT1 27 (SUTURE)
SUT VIC AB 2-0 CT1 TAPERPNT 27 (SUTURE) IMPLANT
SUT VIC AB 2-0 SH 27 (SUTURE) ×2
SUT VIC AB 2-0 SH 27XBRD (SUTURE) ×1 IMPLANT
SUTURE FIBERWR #2 38 T-5 BLUE (SUTURE) IMPLANT
SYR BULB 3OZ (MISCELLANEOUS) ×2 IMPLANT
TOWEL OR NON WOVEN STRL DISP B (DISPOSABLE) ×2 IMPLANT
TUBE EXCHANGE NAIL HUMERAL (TRAUMA) ×1 IMPLANT

## 2013-02-18 NOTE — Transfer of Care (Signed)
Immediate Anesthesia Transfer of Care Note  Patient: [name]  Procedure(s) Performed: Procedure(s): LEFT HUMERAL IM ROD (ANTEGRADE) (Left)  Patient Location: PACU  Anesthesia Type:General  Level of Consciousness: awake, alert , oriented and patient cooperative  Airway & Oxygen Therapy: Patient Spontanous Breathing and Patient connected to face mask oxygen  Post-op Assessment: Report given to PACU RN and Post -op Vital signs reviewed and stable  Post vital signs: Reviewed and stable  Complications: No apparent anesthesia complications

## 2013-02-18 NOTE — Anesthesia Preprocedure Evaluation (Addendum)
Anesthesia Evaluation  Patient identified by MRN, date of birth, ID band Patient awake    Reviewed: Allergy & Precautions, H&P , NPO status , Patient's Chart, lab work & pertinent test results  Airway Mallampati: II  Neck ROM: full    Dental   Pulmonary Current Smoker,  + tobacco use         Cardiovascular negative cardio ROS      Neuro/Psych PSYCHIATRIC DISORDERS Depression    GI/Hepatic   Endo/Other    Renal/GU      Musculoskeletal   Abdominal   Peds  Hematology   Anesthesia Other Findings   Reproductive/Obstetrics                          Anesthesia Physical Anesthesia Plan  ASA: II  Anesthesia Plan: General and Regional   Post-op Pain Management: MAC Combined w/ Regional for Post-op pain   Induction: Intravenous  Airway Management Planned: Oral ETT  Additional Equipment:   Intra-op Plan:   Post-operative Plan: Extubation in OR  Informed Consent: I have reviewed the patients History and Physical, chart, labs and discussed the procedure including the risks, benefits and alternatives for the proposed anesthesia with the patient or authorized representative who has indicated his/her understanding and acceptance.     Plan Discussed with: CRNA, Anesthesiologist and Surgeon  Anesthesia Plan Comments:         Anesthesia Quick Evaluation

## 2013-02-18 NOTE — Anesthesia Postprocedure Evaluation (Signed)
Anesthesia Post Note  Patient: [name]  Procedure(s) Performed: Procedure(s) (LRB): LEFT HUMERAL IM ROD (ANTEGRADE) (Left)  Anesthesia type: General  Patient location: PACU  Post pain: Pain level controlled and Adequate analgesia  Post assessment: Post-op Vital signs reviewed, Patient's Cardiovascular Status Stable, Respiratory Function Stable, Patent Airway and Pain level controlled  Last Vitals:  Filed Vitals:   02/18/13 1830  BP: 122/73  Pulse: 76  Temp:   Resp: 12    Post vital signs: Reviewed and stable  Level of consciousness: awake, alert  and oriented  Complications: No apparent anesthesia complications

## 2013-02-18 NOTE — H&P (Signed)
  PREOPERATIVE H&P  Chief Complaint: l humeral fracture  HPI: [name] is a 27 y.o. female who presents for evaluation of l humeral fracture. It has been present for 2 days and has been worsening. She has failed conservative measures. Pain is rated as severe.  Past Medical History  Diagnosis Date  . Depression   . Fracture, humerus closed     Left   Past Surgical History  Procedure Laterality Date  . Wisdom tooth extraction     History   Social History  . Marital Status: Single    Spouse Name: N/A    Number of Children: N/A  . Years of Education: N/A   Social History Main Topics  . Smoking status: Current Every Day Smoker  . Smokeless tobacco: None  . Alcohol Use: Yes     Comment: social  . Drug Use: No  . Sexual Activity: Yes    Birth Control/ Protection: Condom   Other Topics Concern  . None   Social History Narrative  . None   History reviewed. No pertinent family history. Allergies  Allergen Reactions  . Divalproex Sodium Hives   Prior to Admission medications   Medication Sig Start Date End Date Taking? Authorizing Provider  ALPRAZolam Prudy Feeler) 0.25 MG tablet Take 0.25 mg by mouth at bedtime as needed for sleep.   Yes Historical Provider, MD  oxyCODONE-acetaminophen (PERCOCET/ROXICET) 5-325 MG per tablet Take 1-2 pills every 4-6 hours as needed for pain. 02/16/13  Yes Junius Finner, PA-C  promethazine (PHENERGAN) 25 MG tablet Take 1 tablet (25 mg total) by mouth every 6 (six) hours as needed for nausea. 02/16/13  Yes Junius Finner, PA-C     Positive ROS: none  All other systems have been reviewed and were otherwise negative with the exception of those mentioned in the HPI and as above.  Physical Exam: Filed Vitals:   02/18/13 1246  BP: 123/82  Pulse: 85  Temp: 98.4 F (36.9 C)  Resp: 16    General: Alert, no acute distress Cardiovascular: No pedal edema Respiratory: No cyanosis, no use of accessory musculature GI: No organomegaly, abdomen  is soft and non-tender Skin: No lesions in the area of chief complaint Neurologic: Sensation intact distally Psychiatric: Patient is competent for consent with normal mood and affect Lymphatic: No axillary or cervical lymphadenopathy  MUSCULOSKELETAL: l arm painful with all rom.  Rad/median/ulnar n grossly intact to movement but decreased sensation globally over entire hand and forearm  Assessment/Plan: Left Humerus Fracture Plan for Procedure(s): LEFT HUMERAL IM ROD (ANTEGRADE) with rotator cuff repair  The risks benefits and alternatives were discussed with the patient including but not limited to the risks of nonoperative treatment, versus surgical intervention including infection, bleeding, nerve injury, malunion, nonunion, hardware prominence, hardware failure, need for hardware removal, blood clots, cardiopulmonary complications, morbidity, mortality, among others, and they were willing to proceed.  Predicted outcome is good, although there will be at least a six to nine month expected recovery.  Harvie Junior, MD 02/18/2013 1:39 PM

## 2013-02-18 NOTE — Anesthesia Procedure Notes (Signed)
Procedure Name: Intubation Date/Time: 02/18/2013 2:35 PM Performed by: Harvie Junior Pre-anesthesia Checklist: Patient identified, Emergency Drugs available, Suction available and Patient being monitored Patient Re-evaluated:Patient Re-evaluated prior to inductionOxygen Delivery Method: Circle System Utilized Preoxygenation: Pre-oxygenation with 100% oxygen Intubation Type: IV induction Ventilation: Mask ventilation without difficulty Laryngoscope Size: Mac and 3 Grade View: Grade I Tube type: Oral Tube size: 7.0 mm Number of attempts: 1 Airway Equipment and Method: stylet Placement Confirmation: ETT inserted through vocal cords under direct vision,  positive ETCO2 and breath sounds checked- equal and bilateral Secured at: 21 cm Tube secured with: Tape Dental Injury: Teeth and Oropharynx as per pre-operative assessment

## 2013-02-18 NOTE — Brief Op Note (Signed)
02/18/2013  5:03 PM  PATIENT:  [name]  27 y.o. female  PRE-OPERATIVE DIAGNOSIS:  Left Humerus Fracture  POST-OPERATIVE DIAGNOSIS:  Left Humerus Fracture  PROCEDURE:  Procedure(s): LEFT HUMERAL IM ROD (ANTEGRADE) (Left)  SURGEON:  Surgeon(s) and Role:    * Harvie Junior, MD - Primary  PHYSICIAN ASSISTANT:   ASSISTANTS: bethune   ANESTHESIA:   general  EBL:  Total I/O In: 2200 [I.V.:2200] Out: -   BLOOD ADMINISTERED:none  DRAINS: none   LOCAL MEDICATIONS USED:  MARCAINE     SPECIMEN:  No Specimen  DISPOSITION OF SPECIMEN:  N/A  COUNTS:  YES  TOURNIQUET:  * No tourniquets in log *  DICTATION: .Other Dictation: Dictation Number [id number]  PLAN OF CARE: Admit for overnight observation  PATIENT DISPOSITION:  PACU - hemodynamically stable.   Delay start of Pharmacological VTE agent (>24hrs) due to surgical blood loss or risk of bleeding: no

## 2013-02-19 MED ORDER — PROMETHAZINE HCL 25 MG PO TABS
ORAL_TABLET | ORAL | Status: AC
  Filled 2013-02-19: qty 1

## 2013-02-19 MED ORDER — OXYCODONE-ACETAMINOPHEN 5-325 MG PO TABS
ORAL_TABLET | ORAL | Status: AC
  Filled 2013-02-19: qty 2

## 2013-02-19 MED ORDER — CEFAZOLIN SODIUM 1-5 GM-% IV SOLN
INTRAVENOUS | Status: AC
  Filled 2013-02-19: qty 50

## 2013-02-20 ENCOUNTER — Encounter (HOSPITAL_BASED_OUTPATIENT_CLINIC_OR_DEPARTMENT_OTHER): Payer: Self-pay | Admitting: Orthopedic Surgery

## 2013-02-20 NOTE — Op Note (Signed)
[name], [name]             ACCOUNT NO.:  [id number]  MEDICAL RECORD NO.:  [id number]  LOCATION:                               FACILITY:  [facility]  PHYSICIAN:  Harvie Junior, M.D.   DATE OF BIRTH:  04-Apr-1986  DATE OF PROCEDURE:  02/18/2013 DATE OF DISCHARGE:  02/19/2013                              OPERATIVE REPORT   PREOPERATIVE DIAGNOSIS:  Displaced midshaft spiral humeral fracture.  POSTOPERATIVE DIAGNOSIS:  Displaced midshaft spiral humeral fracture.  PROCEDURES: 1. Intramedullary rod fixation of left midshaft spiral humerus     fracture. 2. Rotator cuff repair, left shoulder. 3. Intraoperative interpretation of multiple fluoroscopic images.  SURGEON:  Harvie Junior, MD.  ASSISTANT:  Marshia Ly, PA.  ANESTHESIA:  General.  BRIEF HISTORY:  [name] is a 27 year old female who was at work and suffered a spiral fracture to her humerus when she was caught in a machine that was torquing her arm.  They were ultimately able to free her from the machine.  She was seen in the emergency room where she was noted to have a spiral fracture and sent to our office.  At that point, we had a long discussion of treatment options including the possibility of treatment with a Sarmiento style brace after a few days to allow the swelling to go down versus the possibility of open reduction and internal fixation versus the possibility of intramedullary rodding.  She ultimately thought over options and felt that she wanted to have intramedullary rodding.  We discussed the possibility of shoulder issues relative to going through the rotator cuff and she felt this was acceptable and she was taken to the operating room for this procedure. Preoperatively, she was noted to have motor function of the radial, ulnar, and median nerve.  She had globally decreased sensation over the entire arm and a fairly dense sensation.  We discussed the possibility of radial nerve injury at the time of  the surgery.  She certainly understood this risk and she came to the operating room for this procedure.  DESCRIPTION OF PROCEDURE:  The patient was brought to the operating room.  After adequate anesthesia was achieved with general anesthetic, the patient was placed supine on the operating table.  She was then moved into the beach-chair position.  All bony prominences were well padded.  Attention was turned to the left shoulder after prep and drape. An incision was made for superior approach to the shoulder, subcutaneous tissue down the level of the deltoid and the deltoid was divided in line with its fibers.  The rotator cuff was identified underneath and a bursectomy was performed.  Following this, the attention was turned towards finding the biceps tendon.  Once we found the biceps tendon, we went 1 cm posterior to the biceps tendon and made a rent in the rotator cuff medial to its insertion.  A guidewire was then placed right on the border of the articular surface, and this was placed down the shaft and this then was over-reamed with a pilot hole.  Once this was done, a guidewire was placed across the fracture site and then the arm was sequentially reamed up to a level of  9 mm and an 8 mm x 22 mm rod was placed, and once this was done, it was locked in proximally with an oblique screw bicortical and then distally with a freehand with a 3.5 bicortical screw.  Fluoroscopic images were used throughout to make sure that the rod was in appropriate location, and we got excellent fixation and alignment.  Once this was done, attention was turned back to the rotator cuff where two #2 Ethibond figure-of-eight stitches were passed through the rotator cuff to achieve excellent fixation and cuff repair. Once this was done, the wound was copiously and thoroughly irrigated, suctioned dry.  The deltoid was closed with 1 Vicryl running, skin with 2-0 Vicryl and 3-0 Monocryl subcuticular.  Benzoin and  Steri-Strips were applied.  Sterile compressive dressing was applied.  The patient was taken to the recovery room and was noted to be in a satisfactory condition.  Estimated blood loss for the procedure was 250 mL.     Harvie Junior, M.D.     Ranae Plumber  D:  02/18/2013  T:  02/19/2013  Job:  [id number]

## 2013-02-21 ENCOUNTER — Observation Stay: Payer: Self-pay | Admitting: Family Medicine

## 2013-02-21 LAB — URINALYSIS, COMPLETE
Bacteria: NONE SEEN
Blood: NEGATIVE
Leukocyte Esterase: NEGATIVE
Nitrite: NEGATIVE
Ph: 8 (ref 4.5–8.0)
Protein: NEGATIVE
RBC,UR: 1 /HPF (ref 0–5)
Specific Gravity: 1.006 (ref 1.003–1.030)
Squamous Epithelial: 5
WBC UR: 4 /HPF (ref 0–5)

## 2013-02-21 LAB — CBC WITH DIFFERENTIAL/PLATELET
Basophil #: 0 10*3/uL (ref 0.0–0.1)
Eosinophil %: 3 %
HGB: 12 g/dL (ref 12.0–16.0)
Lymphocyte #: 2 10*3/uL (ref 1.0–3.6)
Lymphocyte %: 27.8 %
MCHC: 34.7 g/dL (ref 32.0–36.0)
MCV: 91 fL (ref 80–100)
Monocyte #: 0.5 x10 3/mm (ref 0.2–0.9)
Monocyte %: 7.1 %
Neutrophil #: 4.5 10*3/uL (ref 1.4–6.5)
Neutrophil %: 61.5 %
Platelet: 208 10*3/uL (ref 150–440)

## 2013-02-21 LAB — BASIC METABOLIC PANEL
Anion Gap: 8 (ref 7–16)
Chloride: 108 mmol/L — ABNORMAL HIGH (ref 98–107)
Co2: 26 mmol/L (ref 21–32)
Creatinine: 0.76 mg/dL (ref 0.60–1.30)
EGFR (African American): >60
Glucose: 83 mg/dL (ref 65–99)
Osmolality: 279 (ref 275–301)
Potassium: 3.4 mmol/L — ABNORMAL LOW (ref 3.5–5.1)
Sodium: 142 mmol/L (ref 136–145)

## 2013-02-21 LAB — DRUG SCREEN, URINE
Amphetamines, Ur Screen: NEGATIVE (ref ?–1000)
Barbiturates, Ur Screen: NEGATIVE (ref ?–200)
Cannabinoid 50 Ng, Ur ~~LOC~~: POSITIVE (ref ?–50)
Cocaine Metabolite,Ur ~~LOC~~: NEGATIVE (ref ?–300)
MDMA (Ecstasy)Ur Screen: NEGATIVE (ref ?–500)
Opiate, Ur Screen: POSITIVE (ref ?–300)
Phencyclidine (PCP) Ur S: NEGATIVE (ref ?–25)

## 2013-02-21 LAB — TROPONIN I: Troponin-I: <0.02 ng/mL

## 2013-02-22 LAB — BASIC METABOLIC PANEL
Anion Gap: 5 — ABNORMAL LOW (ref 7–16)
BUN: 5 mg/dL — ABNORMAL LOW (ref 7–18)
Calcium, Total: 8 mg/dL — ABNORMAL LOW (ref 8.5–10.1)
Chloride: 109 mmol/L — ABNORMAL HIGH (ref 98–107)
Co2: 27 mmol/L (ref 21–32)
Creatinine: 0.68 mg/dL (ref 0.60–1.30)
EGFR (African American): >60
EGFR (Non-African Amer.): >60
Glucose: 86 mg/dL (ref 65–99)
Osmolality: 278 (ref 275–301)
Potassium: 3.4 mmol/L — ABNORMAL LOW (ref 3.5–5.1)

## 2013-02-22 LAB — CBC WITH DIFFERENTIAL/PLATELET
Basophil #: 0 10*3/uL (ref 0.0–0.1)
Basophil %: 0.8 %
Eosinophil #: 0.2 10*3/uL (ref 0.0–0.7)
HCT: 30.3 % — ABNORMAL LOW (ref 35.0–47.0)
HGB: 10.7 g/dL — ABNORMAL LOW (ref 12.0–16.0)
Lymphocyte #: 2.1 10*3/uL (ref 1.0–3.6)
Monocyte %: 7.2 %
Neutrophil #: 1.9 10*3/uL (ref 1.4–6.5)
Platelet: 181 10*3/uL (ref 150–440)
RBC: 3.32 10*6/uL — ABNORMAL LOW (ref 3.80–5.20)
RDW: 13.3 % (ref 11.5–14.5)

## 2013-02-22 LAB — TSH: Thyroid Stimulating Horm: 0.422 u[IU]/mL — ABNORMAL LOW

## 2013-11-21 ENCOUNTER — Emergency Department (INDEPENDENT_AMBULATORY_CARE_PROVIDER_SITE_OTHER)
Admission: EM | Admit: 2013-11-21 | Discharge: 2013-11-21 | Disposition: A | Discharge status: Home / Self Care | Payer: Self-pay | Source: Home / Self Care | Attending: Family Medicine | Admitting: Family Medicine

## 2013-11-21 ENCOUNTER — Encounter (HOSPITAL_COMMUNITY): Payer: Self-pay | Admitting: Emergency Medicine

## 2013-11-21 DIAGNOSIS — L738 Other specified follicular disorders: Secondary | ICD-10-CM

## 2013-11-21 DIAGNOSIS — L731 Pseudofolliculitis barbae: Secondary | ICD-10-CM

## 2013-11-21 DIAGNOSIS — L678 Other hair color and hair shaft abnormalities: Secondary | ICD-10-CM

## 2013-11-21 MED ORDER — SULFAMETHOXAZOLE-TRIMETHOPRIM 800-160 MG PO TABS: 2.0000 | ORAL_TABLET | Freq: Two times a day (BID) | ORAL | Status: DC

## 2013-11-21 NOTE — ED Notes (Signed)
Pt c/o multiple abscess in the groin are x 2 days Tender; pain increases when walking Denies fevers Alert w/no signs of acute distress.

## 2013-11-21 NOTE — Discharge Instructions (Signed)
Thank you for coming in today. Take Bactrim twice daily for one week. Make sure to use birth control while having sex on this medication. Come back as needed 'Continue warm compress  Ingrown Hair An ingrown hair is a hair that curls and re-enters the skin instead of growing straight out of the skin. It happens most often with curly hair. It is usually more severe in the neck area, but it can occur in any shaved area, including the beard area, groin, scalp, and legs. An ingrown hair may cause small pockets of infection. CAUSES  Shaving closely, tweezing, or waxing, especially curly hair. Using hair removal creams can sometimes lead to ingrown hairs, especially in the groin. SYMPTOMS   Small bumps on the skin. The bumps may be filled with pus.  Pain.  Itching. DIAGNOSIS  Your caregiver can usually tell what is wrong by doing a physical exam. TREATMENT  If there is a severe infection, your caregiver may prescribe antibiotic medicines. Laser hair removal may also be done to help prevent regrowth of the hair. HOME CARE INSTRUCTIONS   Do not shave irritated skin. You may start shaving again once the irritation has gone away.  If you are prone to ingrown hairs, consider not shaving as much as possible.  If antibiotics are prescribed, take them as directed. Finish them even if you start to feel better.  You may use a facial sponge in a gentle circular motion to help dislodge ingrown hairs on the face.  You may use a hair removal cream weekly, especially on the legs and underarms. Stop using the cream if it irritates your skin. Use caution when using hair removal creams in the groin area. SHAVING INSTRUCTIONS AFTER TREATMENT  Shower before shaving. Keep areas to be shaved packed in warm, moist wraps for several minutes before shaving. The warm, moist environment helps soften the hairs and makes ingrown hairs less likely to occur.  Use thick shaving gels.  Use a bump fighter razor that  cuts hair slightly above the skin level or use an electric shaver with a longer shave setting.  Shave in the direction of hair growth. Avoid making multiple razor strokes.  Use moisturizing lotions after shaving. Document Released: 07/09/2000 Document Revised: 10/02/2011 Document Reviewed: 07/03/2011 [facility] Patient Information 2015 [place], [place]. This information is not intended to replace advice given to you by your health care provider. Make sure you discuss any questions you have with your health care provider.

## 2013-11-21 NOTE — ED Provider Notes (Signed)
[name] is a 28 y.o. female who presents to Urgent Care today for painful papule left inguinal area. Symptoms present for a few days. No fevers or chills nausea vomiting or diarrhea. Patient has tried warm compress which has helped a little. Symptoms are moderate and worse with activity.   Past Medical History  Diagnosis Date  . Depression   . Fracture, humerus closed     Left   History  Substance Use Topics  . Smoking status: Current Every Day Smoker  . Smokeless tobacco: Not on file  . Alcohol Use: Yes     Comment: social   ROS as above Medications: No current facility-administered medications for this encounter.   Current Outpatient Prescriptions  Medication Sig Dispense Refill  . CLONAZEPAM PO Take by mouth.      . FLUOXETINE HCL PO Take by mouth.      . QUEtiapine Fumarate (SEROQUEL PO) Take by mouth.      . zolpidem (AMBIEN) 5 MG tablet Take 5 mg by mouth at bedtime as needed for sleep.      Marland Kitchen ALPRAZolam (XANAX) 0.25 MG tablet Take 0.25 mg by mouth at bedtime as needed for sleep.      Marland Kitchen oxyCODONE-acetaminophen (PERCOCET/ROXICET) 5-325 MG per tablet Take 1-2 pills every 4-6 hours as needed for pain.  15 tablet  0  . oxyCODONE-acetaminophen (PERCOCET/ROXICET) 5-325 MG per tablet Take 1-2 tablets by mouth every 6 (six) hours as needed for severe pain.  50 tablet  0  . promethazine (PHENERGAN) 25 MG tablet Take 1 tablet (25 mg total) by mouth every 6 (six) hours as needed for nausea.  12 tablet  0  . sulfamethoxazole-trimethoprim (SEPTRA DS) 800-160 MG per tablet Take 2 tablets by mouth 2 (two) times daily.  28 tablet  0    Exam:  BP 127/73  Pulse 96  Temp(Src) 98.4 F (36.9 C) (Oral)  Resp 18  Ht 4\' 11"  (1.499 m)  Wt 151 lb (68.493 kg)  BMI 30.48 kg/m2  SpO2 99%  LMP 11/12/2013 Gen: Well NAD  Skin: Skin just lateral to left labia erythematous papule without fluctuance. No induration. Tender to touch.  No results found for this or any previous visit (from the  past 24 hour(s)). No results found.  Assessment and Plan: 28 y.o. female with ingrown hair versus early abscess. Plan to treat with Bactrim. Followup as needed.  Discussed warning signs or symptoms. Please see discharge instructions. Patient expresses understanding.   This note was created using Systems analyst. Any transcription errors are unintended.    Gregor Hams, MD 11/21/13 [phone]

## 2014-01-19 ENCOUNTER — Emergency Department (INDEPENDENT_AMBULATORY_CARE_PROVIDER_SITE_OTHER)
Admission: EM | Admit: 2014-01-19 | Discharge: 2014-01-19 | Disposition: A | Discharge status: Home / Self Care | Payer: Self-pay | Source: Home / Self Care | Attending: Family Medicine | Admitting: Family Medicine

## 2014-01-19 ENCOUNTER — Encounter (HOSPITAL_COMMUNITY): Payer: Self-pay | Admitting: Emergency Medicine

## 2014-01-19 DIAGNOSIS — K029 Dental caries, unspecified: Secondary | ICD-10-CM

## 2014-01-19 MED ORDER — AMOXICILLIN 500 MG PO CAPS: 500.0000 mg | ORAL_CAPSULE | Freq: Three times a day (TID) | ORAL | Status: DC

## 2014-01-19 MED ORDER — DICLOFENAC POTASSIUM 50 MG PO TABS: 50.0000 mg | ORAL_TABLET | Freq: Three times a day (TID) | ORAL | Status: DC

## 2014-01-19 NOTE — Discharge Instructions (Signed)
Take medicine as prescribed, see your dentist as soon as possible °

## 2014-01-19 NOTE — ED Notes (Signed)
Reports left sided facial swelling.  Top left dental pain.  On set last night.  No relief with otc meds and warm compresses.

## 2014-01-19 NOTE — ED Provider Notes (Signed)
CSN: [id number]     Arrival date & time 01/19/14  1849 History   First MD Initiated Contact with Patient 01/19/14 1927     Chief Complaint  Patient presents with  . Dental Pain   (Consider location/radiation/quality/duration/timing/severity/associated sxs/prior Treatment) Patient is a 28 y.o. female presenting with tooth pain. The history is provided by the patient.  Dental Pain Location:  Upper Upper teeth location:  14/LU 1st molar Quality:  Throbbing Severity:  Moderate Onset quality:  Gradual Duration:  2 days Progression:  Worsening Chronicity:  New Context: dental caries and poor dentition   Relieved by:  None tried Worsened by:  Nothing tried Ineffective treatments:  None tried Associated symptoms: facial pain and facial swelling     No past medical history on file. No past surgical history on file. No family history on file. History  Substance Use Topics  . Smoking status: Not on file  . Smokeless tobacco: Not on file  . Alcohol Use: Not on file   OB History   No data available     Review of Systems  Constitutional: Negative.   HENT: Positive for dental problem and facial swelling. Negative for rhinorrhea and sinus pressure.     Allergies  Depakote  Home Medications   Prior to Admission medications   Medication Sig Start Date End Date Taking? Authorizing Provider  FLUoxetine (PROZAC) 20 MG capsule Take 20 mg by mouth daily.   Yes Historical Provider, MD  amoxicillin (AMOXIL) 500 MG capsule Take 1 capsule (500 mg total) by mouth 3 (three) times daily. 01/19/14   Billy Fischer, MD  diclofenac (CATAFLAM) 50 MG tablet Take 1 tablet (50 mg total) by mouth 3 (three) times daily. 01/19/14   Billy Fischer, MD   BP 130/95  Pulse 94  Temp(Src) 99.1 F (37.3 C) (Oral)  Resp 18  Ht 4\' 11"  (1.499 m)  SpO2 100% Physical Exam  Nursing note and vitals reviewed. Constitutional: She appears well-developed and well-nourished.  HENT:  Head: Normocephalic.  Right  Ear: External ear normal.  Left Ear: External ear normal.  Nose: Nose normal.  Mouth/Throat: Uvula is midline and oropharynx is clear and moist. Abnormal dentition. Dental abscesses and dental caries present.    ED Course  Procedures (including critical care time) Labs Review Labs Reviewed - No data to display  Imaging Review No results found.   MDM   1. Pain due to dental caries       Billy Fischer, MD 01/19/14 [phone]

## 2014-01-20 ENCOUNTER — Telehealth: Payer: Self-pay

## 2014-01-20 NOTE — Telephone Encounter (Signed)
Patient states she was seen yesterday for a swollen face.  Patient is not on DAR - advised patient.

## 2014-01-21 ENCOUNTER — Emergency Department (HOSPITAL_COMMUNITY)
Admission: EM | Admit: 2014-01-21 | Discharge: 2014-01-21 | Disposition: A | Discharge status: Home / Self Care | Payer: Self-pay | Attending: Emergency Medicine | Admitting: Emergency Medicine

## 2014-01-21 ENCOUNTER — Emergency Department (HOSPITAL_COMMUNITY): Payer: Self-pay

## 2014-01-21 ENCOUNTER — Encounter (HOSPITAL_COMMUNITY): Payer: Self-pay | Admitting: Emergency Medicine

## 2014-01-21 DIAGNOSIS — Z720 Tobacco use: Secondary | ICD-10-CM | POA: Insufficient documentation

## 2014-01-21 DIAGNOSIS — K08409 Partial loss of teeth, unspecified cause, unspecified class: Secondary | ICD-10-CM | POA: Insufficient documentation

## 2014-01-21 DIAGNOSIS — Z792 Long term (current) use of antibiotics: Secondary | ICD-10-CM | POA: Insufficient documentation

## 2014-01-21 DIAGNOSIS — Z8781 Personal history of (healed) traumatic fracture: Secondary | ICD-10-CM | POA: Insufficient documentation

## 2014-01-21 DIAGNOSIS — K047 Periapical abscess without sinus: Secondary | ICD-10-CM | POA: Insufficient documentation

## 2014-01-21 DIAGNOSIS — Z79899 Other long term (current) drug therapy: Secondary | ICD-10-CM | POA: Insufficient documentation

## 2014-01-21 DIAGNOSIS — F329 Major depressive disorder, single episode, unspecified: Secondary | ICD-10-CM | POA: Insufficient documentation

## 2014-01-21 LAB — CBC WITH DIFFERENTIAL/PLATELET
BASOS PCT: 0 % (ref 0–1)
Basophils Absolute: 0 10*3/uL (ref 0.0–0.1)
Eosinophils Absolute: 0.2 10*3/uL (ref 0.0–0.7)
Eosinophils Relative: 3 % (ref 0–5)
HCT: 38 % (ref 36.0–46.0)
Hemoglobin: 13.5 g/dL (ref 12.0–15.0)
LYMPHS ABS: 1.5 10*3/uL (ref 0.7–4.0)
Lymphocytes Relative: 20 % (ref 12–46)
MCH: 30.4 pg (ref 26.0–34.0)
MCHC: 35.5 g/dL (ref 30.0–36.0)
MCV: 85.6 fL (ref 78.0–100.0)
Monocytes Absolute: 0.5 10*3/uL (ref 0.1–1.0)
Monocytes Relative: 6 % (ref 3–12)
Neutro Abs: 5.3 10*3/uL (ref 1.7–7.7)
Neutrophils Relative %: 71 % (ref 43–77)
PLATELETS: 189 10*3/uL (ref 150–400)
RBC: 4.44 MIL/uL (ref 3.87–5.11)
RDW: 12.7 % (ref 11.5–15.5)
WBC: 7.4 10*3/uL (ref 4.0–10.5)

## 2014-01-21 LAB — BASIC METABOLIC PANEL
ANION GAP: 12 (ref 5–15)
BUN: 6 mg/dL (ref 6–23)
CO2: 24 mEq/L (ref 19–32)
Calcium: 8.5 mg/dL (ref 8.4–10.5)
Chloride: 102 mEq/L (ref 96–112)
Creatinine, Ser: 0.75 mg/dL (ref 0.50–1.10)
GLUCOSE: 102 mg/dL — AB (ref 70–99)
POTASSIUM: 3.5 meq/L — AB (ref 3.7–5.3)
Sodium: 138 mEq/L (ref 137–147)

## 2014-01-21 MED ORDER — CLINDAMYCIN PHOSPHATE 600 MG/50ML IV SOLN
600.0000 mg | Freq: Once | INTRAVENOUS | Status: AC
  Administered 2014-01-21: 600 mg via INTRAVENOUS
  Filled 2014-01-21: qty 50

## 2014-01-21 MED ORDER — SODIUM CHLORIDE 0.9 % IV BOLUS (SEPSIS)
1000.0000 mL | Freq: Once | INTRAVENOUS | Status: AC
  Administered 2014-01-21: 1000 mL via INTRAVENOUS

## 2014-01-21 MED ORDER — ONDANSETRON HCL 4 MG/2ML IJ SOLN
4.0000 mg | Freq: Once | INTRAMUSCULAR | Status: AC
  Administered 2014-01-21: 4 mg via INTRAVENOUS
  Filled 2014-01-21: qty 2

## 2014-01-21 MED ORDER — CLINDAMYCIN HCL 150 MG PO CAPS: 450.0000 mg | ORAL_CAPSULE | Freq: Three times a day (TID) | ORAL | Status: AC

## 2014-01-21 MED ORDER — IOHEXOL 300 MG/ML  SOLN
80.0000 mL | Freq: Once | INTRAMUSCULAR | Status: AC | PRN
  Administered 2014-01-21: 80 mL via INTRAVENOUS

## 2014-01-21 MED ORDER — MORPHINE SULFATE 4 MG/ML IJ SOLN
4.0000 mg | Freq: Once | INTRAMUSCULAR | Status: AC
  Administered 2014-01-21: 4 mg via INTRAVENOUS
  Filled 2014-01-21: qty 1

## 2014-01-21 MED ORDER — OXYCODONE-ACETAMINOPHEN 5-325 MG PO TABS: 1.0000 | ORAL_TABLET | ORAL | Status: DC | PRN

## 2014-01-21 NOTE — ED Notes (Signed)
Dr.Lockwood made aware of patient's increased swelling

## 2014-01-21 NOTE — ED Notes (Signed)
Pt reports "the pocket burst when I was in the scans. There's a terrible taste in my mouth." Attempted to assess area; pt's mouth and cheek swollen to under eye; unable to assess area

## 2014-01-21 NOTE — ED Notes (Signed)
Pt unable to void at this time pt is aware of needed void

## 2014-01-21 NOTE — Discharge Instructions (Signed)

## 2014-01-21 NOTE — ED Provider Notes (Signed)
CSN: [id number]     Arrival date & time 01/21/14  1528 History   First MD Initiated Contact with Patient 01/21/14 1552     Chief Complaint  Patient presents with  . Dental Pain     (Consider location/radiation/quality/duration/timing/severity/associated sxs/prior Treatment) Patient is a 28 y.o. female presenting with tooth pain.  Dental Pain Location:  Upper Upper teeth location:  14/LU 1st molar Quality:  Aching Severity:  Severe Onset quality:  Gradual Duration:  4 days Timing:  Constant Progression:  Worsening Chronicity:  New Context: abscess and dental fracture   Relieved by:  Nothing Worsened by:  Jaw movement Ineffective treatments:  NSAIDs (amoxicillin) Associated symptoms: facial pain, facial swelling and trismus   Associated symptoms: no congestion, no fever and no headaches   Risk factors: lack of dental care and smoking     Past Medical History  Diagnosis Date  . Depression   . Fracture, humerus closed     Left   Past Surgical History  Procedure Laterality Date  . Wisdom tooth extraction    . Humerus im nail Left 02/18/2013    Procedure: LEFT HUMERAL IM ROD (ANTEGRADE);  Surgeon: Alta Corning, MD;  Location: [facility];  Service: Orthopedics;  Laterality: Left;   No family history on file. History  Substance Use Topics  . Smoking status: Current Every Day Smoker  . Smokeless tobacco: Not on file  . Alcohol Use: Yes     Comment: social   OB History   Grav Para Term Preterm Abortions TAB SAB Ect Mult Living                 Review of Systems  Constitutional: Negative for fever and chills.  HENT: Positive for dental problem, facial swelling and trouble swallowing. Negative for congestion.   Eyes: Negative for visual disturbance.  Respiratory: Negative for cough, shortness of breath and wheezing.   Cardiovascular: Negative for chest pain.  Gastrointestinal: Negative for nausea, vomiting, abdominal pain, diarrhea and constipation.    Genitourinary: Negative for dysuria, difficulty urinating and vaginal pain.  Musculoskeletal: Negative for arthralgias and myalgias.  Skin: Negative for rash.  Neurological: Negative for syncope and headaches.  Psychiatric/Behavioral: Negative for behavioral problems.  All other systems reviewed and are negative.     Allergies  Divalproex sodium  Home Medications   Prior to Admission medications   Medication Sig Start Date End Date Taking? Authorizing Provider  amoxicillin (AMOXIL) 500 MG capsule Take 500 mg by mouth 3 (three) times daily. Started on 01-19-14 for 10 day therapy   Yes Historical Provider, MD  CLONAZEPAM PO Take 1 mg by mouth 2 (two) times daily as needed (anxiety).    Yes Historical Provider, MD  diclofenac (CATAFLAM) 50 MG tablet Take 50 mg by mouth 3 (three) times daily.   Yes Historical Provider, MD  FLUOXETINE HCL PO Take 40 mg by mouth every morning.    Yes Historical Provider, MD  QUEtiapine Fumarate (SEROQUEL PO) Take 50 mg by mouth at bedtime.    Yes Historical Provider, MD  zolpidem (AMBIEN) 5 MG tablet Take 5 mg by mouth at bedtime as needed for sleep.   Yes Historical Provider, MD  clindamycin (CLEOCIN) 150 MG capsule Take 3 capsules (450 mg total) by mouth 3 (three) times daily. 01/21/14 01/31/14  Renne Musca, MD  oxyCODONE-acetaminophen (PERCOCET/ROXICET) 5-325 MG per tablet Take 1 tablet by mouth every 4 (four) hours as needed for severe pain. 01/21/14   Renne Musca, MD  BP 134/82  Pulse 83  Temp(Src) 98.3 F (36.8 C) (Oral)  Resp 16  Ht 4\' 11"  (1.499 m)  Wt 160 lb (72.576 kg)  BMI 32.30 kg/m2  SpO2 98%  LMP 11/26/2013 Physical Exam  Constitutional: She is oriented to person, place, and time. She appears well-developed and well-nourished. No distress.  HENT:  Head: Head is with left periorbital erythema.    Mouth/Throat: There is trismus in the jaw. Abnormal dentition. Dental abscesses present.    Eyes: EOM are normal.  Neck: Normal range of  motion.  Cardiovascular: Normal rate, regular rhythm and normal heart sounds.   No murmur heard. Pulmonary/Chest: Effort normal and breath sounds normal. No respiratory distress. She has no wheezes.  Abdominal: Soft. There is no tenderness.  Musculoskeletal: She exhibits no edema.  Neurological: She is alert and oriented to person, place, and time.  Skin: She is not diaphoretic.  Psychiatric: She has a normal mood and affect. Her behavior is normal.    ED Course  Procedures (including critical care time) Labs Review Labs Reviewed  BASIC METABOLIC PANEL - Abnormal; Notable for the following:    Potassium 3.5 (*)    Glucose, Bld 102 (*)    All other components within normal limits  CBC WITH DIFFERENTIAL    Imaging Review Ct Maxillofacial W/cm  01/21/2014   CLINICAL DATA:  Increased swelling in the face. Known dental infection. Initial encounter.  EXAM: CT MAXILLOFACIAL WITH CONTRAST  TECHNIQUE: Multidetector CT imaging of the maxillofacial structures was performed with intravenous contrast. Multiplanar CT image reconstructions were also generated. A small metallic BB was placed on the right temple in order to reliably differentiate right from left.  CONTRAST:  76mL OMNIPAQUE IOHEXOL 300 MG/ML  SOLN  COMPARISON:  Face CT 02/16/2013.  FINDINGS: A prominent dental caries is present within tooth #14 along the maxilla. There is gas along the buccal surface of the alveolar ridge. No discrete abscess is evident. Asymmetric soft tissue swelling is noted along the left maxillary alveolar ridge and along the buccal mucosa. There is diffuse infiltration of the subcutaneous fat along the mandible the jaw along the mandible and extending superiorly along the left side of the face. No discrete abscess is evident. There is asymmetric left maxillary sinus disease which may be related. The paranasal sinuses and mastoid air cells are otherwise clear.  Limited imaging the brain is unremarkable.  IMPRESSION: 1.  Soft tissue swelling along the buccal surface of the left mandibular alveolar ridge. 2. Asymmetric left focal mucosal thickening. 3. No discrete abscess. 4. Prominent dental caries in tooth #14. This is likely the source of the infection. 5. Extensive soft tissue swelling and infiltration along the mandible and extending through the left side of the face into the intraorbital region.   Electronically Signed   By: Lawrence Santiago M.D.   On: 01/21/2014 18:40     EKG Interpretation None      MDM   Final diagnoses:  Dental abscess    Patient is a 28 year old female that presents with abscess to the left upper first molar. Patient was seen at urgent care on Monday and given amoxicillin for her dental infection. Patient states that the swelling redness to her face has gotten worse. Patient is having a difficult time opening her mouth. Patient denies any airway difficulties. On exam patient is afebrile stable vital signs. Patient has trismus no signs of Ludwig angina, there is an abscess to the lateral 14th tooth. Patient has swelling and  periorbital erythema to the left face.  Given patient's failed outpatient therapy we will administer IV clindamycin, obtain a CT face with contrast and provide pain medication. Will the patient was in CT she felt a pop and a bad taste in her mouth. It appears that the abscess spontaneously ruptured. CT findings as above it is felt the patient is safe for discharge with by mouth clindamycin and dental followup in the morning. Patient provided a history information and the importance of follow with dentistry in the morning was expressed and the patient and mother voiced understanding. The patient had some relief of pain and was able to up her mouth wider following the abscess ruptured.    Renne Musca, MD 01/22/14 [phone]

## 2014-01-21 NOTE — ED Notes (Signed)
Pt reports being seen at [facility] on Monday for dental infection. Pt prescribed antibiotics but swelling in face has increased. Pt sts she now has pocket between gum line. Pt denies fevers/chills.

## 2014-01-26 NOTE — ED Provider Notes (Signed)
This patient was seen in conjunction with the resident physician, Dr. Robina Ade.  The documentation accurately reflects the patient's encounter in the ED.  On my exam, the patient had a patent airway, was in no distress, was afebrile, and aside from left facial swelling, was generally well in appearance. Patient's evaluation demonstrated possible dental infection. Patient was started on antibiotics, and absent evidence for respiratory compromise, sepsis bacteremia, patient was provided with sources to follow up with the dentist.  Carmin Muskrat, MD 01/26/14 [phone]

## 2014-02-01 NOTE — Telephone Encounter (Signed)
NO MSG

## 2014-06-30 ENCOUNTER — Emergency Department (HOSPITAL_COMMUNITY): Payer: Self-pay

## 2014-06-30 ENCOUNTER — Emergency Department (HOSPITAL_COMMUNITY)
Admission: EM | Admit: 2014-06-30 | Discharge: 2014-06-30 | Disposition: A | Discharge status: Home / Self Care | Payer: Self-pay | Attending: Emergency Medicine | Admitting: Emergency Medicine

## 2014-06-30 ENCOUNTER — Encounter (HOSPITAL_COMMUNITY): Payer: Self-pay

## 2014-06-30 DIAGNOSIS — T148 Other injury of unspecified body region: Secondary | ICD-10-CM | POA: Insufficient documentation

## 2014-06-30 DIAGNOSIS — S24109A Unspecified injury at unspecified level of thoracic spinal cord, initial encounter: Secondary | ICD-10-CM | POA: Insufficient documentation

## 2014-06-30 DIAGNOSIS — Z720 Tobacco use: Secondary | ICD-10-CM | POA: Insufficient documentation

## 2014-06-30 DIAGNOSIS — S79912A Unspecified injury of left hip, initial encounter: Secondary | ICD-10-CM | POA: Insufficient documentation

## 2014-06-30 DIAGNOSIS — S8992XA Unspecified injury of left lower leg, initial encounter: Secondary | ICD-10-CM | POA: Insufficient documentation

## 2014-06-30 DIAGNOSIS — F329 Major depressive disorder, single episode, unspecified: Secondary | ICD-10-CM | POA: Insufficient documentation

## 2014-06-30 DIAGNOSIS — Z792 Long term (current) use of antibiotics: Secondary | ICD-10-CM | POA: Insufficient documentation

## 2014-06-30 DIAGNOSIS — W19XXXA Unspecified fall, initial encounter: Secondary | ICD-10-CM

## 2014-06-30 DIAGNOSIS — Y9389 Activity, other specified: Secondary | ICD-10-CM | POA: Insufficient documentation

## 2014-06-30 DIAGNOSIS — S4992XA Unspecified injury of left shoulder and upper arm, initial encounter: Secondary | ICD-10-CM | POA: Insufficient documentation

## 2014-06-30 DIAGNOSIS — S299XXA Unspecified injury of thorax, initial encounter: Secondary | ICD-10-CM | POA: Insufficient documentation

## 2014-06-30 DIAGNOSIS — Y998 Other external cause status: Secondary | ICD-10-CM | POA: Insufficient documentation

## 2014-06-30 DIAGNOSIS — Z8781 Personal history of (healed) traumatic fracture: Secondary | ICD-10-CM | POA: Insufficient documentation

## 2014-06-30 DIAGNOSIS — Z791 Long term (current) use of non-steroidal anti-inflammatories (NSAID): Secondary | ICD-10-CM | POA: Insufficient documentation

## 2014-06-30 DIAGNOSIS — T148XXA Other injury of unspecified body region, initial encounter: Secondary | ICD-10-CM

## 2014-06-30 DIAGNOSIS — Y929 Unspecified place or not applicable: Secondary | ICD-10-CM | POA: Insufficient documentation

## 2014-06-30 DIAGNOSIS — Z79899 Other long term (current) drug therapy: Secondary | ICD-10-CM | POA: Insufficient documentation

## 2014-06-30 DIAGNOSIS — R55 Syncope and collapse: Secondary | ICD-10-CM | POA: Insufficient documentation

## 2014-06-30 DIAGNOSIS — S3992XA Unspecified injury of lower back, initial encounter: Secondary | ICD-10-CM | POA: Insufficient documentation

## 2014-06-30 MED ORDER — METHOCARBAMOL 500 MG PO TABS: 500.0000 mg | ORAL_TABLET | Freq: Four times a day (QID) | ORAL | Status: DC

## 2014-06-30 MED ORDER — HYDROCODONE-ACETAMINOPHEN 5-325 MG PO TABS: ORAL_TABLET | ORAL | Status: DC

## 2014-06-30 MED ORDER — KETOROLAC TROMETHAMINE 60 MG/2ML IM SOLN
60.0000 mg | Freq: Once | INTRAMUSCULAR | Status: AC
  Administered 2014-06-30: 60 mg via INTRAMUSCULAR
  Filled 2014-06-30: qty 2

## 2014-06-30 MED ORDER — HYDROCODONE-ACETAMINOPHEN 5-325 MG PO TABS
1.0000 | ORAL_TABLET | Freq: Once | ORAL | Status: AC
  Administered 2014-06-30: 1 via ORAL
  Filled 2014-06-30: qty 1

## 2014-06-30 MED ORDER — NAPROXEN 500 MG PO TABS: 500.0000 mg | ORAL_TABLET | Freq: Two times a day (BID) | ORAL | Status: DC

## 2014-06-30 NOTE — Discharge Instructions (Signed)
Please read and follow all provided instructions.  Your diagnoses today include:  1. Fall, initial encounter   2. Fall   3. Contusion     Tests performed today include:  X-rays of chest, arm, hip lower back - no broken bones  Vital signs. See below for your results today.   Medications prescribed:   Vicodin (hydrocodone/acetaminophen) - narcotic pain medication  DO NOT drive or perform any activities that require you to be awake and alert because this medicine can make you drowsy. BE VERY CAREFUL not to take multiple medicines containing Tylenol (also called acetaminophen). Doing so can lead to an overdose which can damage your liver and cause liver failure and possibly death.   Naproxen - anti-inflammatory pain medication  Do not exceed 500mg  naproxen every 12 hours, take with food  You have been prescribed an anti-inflammatory medication or NSAID. Take with food. Take smallest effective dose for the shortest duration needed for your pain. Stop taking if you experience stomach pain or vomiting.    Robaxin (methocarbamol) - muscle relaxer medication  DO NOT drive or perform any activities that require you to be awake and alert because this medicine can make you drowsy.   Take any prescribed medications only as directed.  Home care instructions:  Follow any educational materials contained in this packet.  BE VERY CAREFUL not to take multiple medicines containing Tylenol (also called acetaminophen). Doing so can lead to an overdose which can damage your liver and cause liver failure and possibly death.   Follow-up instructions: Please follow-up with your primary care provider in the next 3 days for further evaluation of your symptoms.   Return instructions:  SEEK IMMEDIATE MEDICAL ATTENTION IF:  There is confusion or drowsiness (although children frequently become drowsy after injury).   You cannot awaken the injured person.   You have more than one episode of vomiting.    You notice dizziness or unsteadiness which is getting worse, or inability to walk.   You have convulsions or unconsciousness.   You experience severe, persistent headaches not relieved by Tylenol.  You cannot use arms or legs normally.   There are changes in pupil sizes. (This is the black center in the colored part of the eye)   There is clear or bloody discharge from the nose or ears.   You have change in speech, vision, swallowing, or understanding.   Localized weakness, numbness, tingling, or change in bowel or bladder control.  You have any other emergent concerns.  Additional Information: You have had a head injury which does not appear to require admission at this time.  Your vital signs today were: BP 115/75 mmHg   Pulse 91   Temp(Src) 97.7 F (36.5 C) (Oral)   Resp 22   SpO2 97%   LMP 06/30/2014 If your blood pressure (BP) was elevated above 135/85 this visit, please have this repeated by your doctor within one month. --------------

## 2014-06-30 NOTE — ED Notes (Signed)
Pt reports being thrown from horse 2 days ago.  Sts she was going to follow up with orthopedist next week but pain has progressively gotten worse.  Pt reporting pain with inspiration, left humerus, left hip, and lower back.  Pt ambulatory in triage. Pt with surgery to left humerus in November 2014.

## 2014-06-30 NOTE — ED Provider Notes (Signed)
CSN: [id number]     Arrival date & time 06/30/14  1517 History  This chart was scribed for non-physician practitioner, Carlisle Cater, working with Daleen Bo, MD by Molli Posey, ED Scribe. This patient was seen in room TR05C/TR05C and the patient's care was started at 5:23 PM.    Chief Complaint  Patient presents with  . Fall   The history is provided by the patient. No language interpreter was used.   HPI Comments: [name] is a 29 y.o. female who presents to the Emergency Department complaining of a fall that occurred 2 days ago. Pt states that she was thrown from her horse and landed on her left back, left shoulder and head. Pt states that she experienced a LOC and says that her uncle told her she was unconscious for 5 to 6 minutes. She states that she was confused and had some trouble walking due to pain in the hip after the incident. She states that she has had trouble bearing weight on her left leg due to pain. Pt complains of left arm pain and left lower back pain at this time. She states that she is unable to lift her left arm due to pain. She says that she has been taking tylenol which has failed to provide her pain relief. Pt reports a prior surgical history of left humeral IM rod in 02/2013. She denies vomiting, blurry vision, numbness or tingling in her legs, bladder or bowel incontinence and neck pain.    Past Medical History  Diagnosis Date  . Depression   . Fracture, humerus closed     Left   Past Surgical History  Procedure Laterality Date  . Wisdom tooth extraction    . Humerus im nail Left 02/18/2013    Procedure: LEFT HUMERAL IM ROD (ANTEGRADE);  Surgeon: Alta Corning, MD;  Location: [place];  Service: Orthopedics;  Laterality: Left;   History reviewed. No pertinent family history. History  Substance Use Topics  . Smoking status: Current Every Day Smoker  . Smokeless tobacco: Not on file  . Alcohol Use: Yes     Comment: social   OB  History    No data available     Review of Systems  Constitutional: Negative for fatigue.  HENT: Negative for tinnitus.   Eyes: Negative for photophobia, pain and visual disturbance.  Respiratory: Negative for shortness of breath.   Cardiovascular: Negative for chest pain.  Gastrointestinal: Negative for nausea and vomiting.  Musculoskeletal: Positive for myalgias, back pain, arthralgias and gait problem. Negative for neck pain.  Skin: Negative for wound.  Neurological: Negative for dizziness, weakness, light-headedness, numbness and headaches.  Psychiatric/Behavioral: Negative for confusion and decreased concentration.    Allergies  Divalproex sodium  Home Medications   Prior to Admission medications   Medication Sig Start Date End Date Taking? Authorizing Provider  amoxicillin (AMOXIL) 500 MG capsule Take 500 mg by mouth 3 (three) times daily. Started on 01-19-14 for 10 day therapy    Historical Provider, MD  CLONAZEPAM PO Take 1 mg by mouth 2 (two) times daily as needed (anxiety).     Historical Provider, MD  diclofenac (CATAFLAM) 50 MG tablet Take 50 mg by mouth 3 (three) times daily.    Historical Provider, MD  FLUOXETINE HCL PO Take 40 mg by mouth every morning.     Historical Provider, MD  oxyCODONE-acetaminophen (PERCOCET/ROXICET) 5-325 MG per tablet Take 1 tablet by mouth every 4 (four) hours as needed for severe pain.  01/21/14   Renne Musca, MD  QUEtiapine Fumarate (SEROQUEL PO) Take 50 mg by mouth at bedtime.     Historical Provider, MD  zolpidem (AMBIEN) 5 MG tablet Take 5 mg by mouth at bedtime as needed for sleep.    Historical Provider, MD   BP 115/75 mmHg  Pulse 91  Temp(Src) 97.7 F (36.5 C) (Oral)  Resp 22  SpO2 97%  LMP 06/30/2014 Physical Exam  Constitutional: She is oriented to person, place, and time. She appears well-developed and well-nourished.  HENT:  Head: Normocephalic and atraumatic. Head is without raccoon's eyes and without Battle's sign.  Right  Ear: Tympanic membrane, external ear and ear canal normal. No hemotympanum.  Left Ear: Tympanic membrane, external ear and ear canal normal. No hemotympanum.  Nose: Nose normal. No nasal septal hematoma.  Mouth/Throat: Uvula is midline, oropharynx is clear and moist and mucous membranes are normal.  Eyes: Conjunctivae, EOM and lids are normal. Pupils are equal, round, and reactive to light. Right eye exhibits no discharge. Left eye exhibits no discharge. Right eye exhibits no nystagmus. Left eye exhibits no nystagmus.  No visible hyphema noted  Neck: Normal range of motion. Neck supple. No tracheal deviation present.  Cardiovascular: Normal rate, regular rhythm and intact distal pulses.   No murmur heard. Pulmonary/Chest: Effort normal and breath sounds normal. No respiratory distress. She has no wheezes. She has no rales. She exhibits tenderness (generalized).  Abdominal: Soft. She exhibits no distension. There is no tenderness.  Musculoskeletal:       Right shoulder: Normal.       Left shoulder: She exhibits decreased range of motion, tenderness (laterally) and bony tenderness. She exhibits no swelling.       Right elbow: Normal.      Left elbow: Normal.       Right wrist: Normal.       Left wrist: Normal.       Right hip: Normal.       Left hip: She exhibits tenderness and bony tenderness. She exhibits normal range of motion and normal strength.       Right knee: Normal.       Left knee: Normal.       Right ankle: Normal.       Left ankle: Normal.       Cervical back: She exhibits normal range of motion, no tenderness and no bony tenderness.       Thoracic back: She exhibits tenderness. She exhibits normal range of motion and no bony tenderness.       Lumbar back: She exhibits no tenderness and no bony tenderness.       Left upper arm: Normal.       Left forearm: Normal.       Left hand: Normal.       Legs: Neurological: She is alert and oriented to person, place, and time. She has  normal strength and normal reflexes. No cranial nerve deficit or sensory deficit. Coordination normal. GCS eye subscore is 4. GCS verbal subscore is 5. GCS motor subscore is 6.  Patient ambulatory without difficulty but slowly.  Skin: Skin is warm and dry.  Psychiatric: She has a normal mood and affect.  Nursing note and vitals reviewed.   ED Course  Procedures  DIAGNOSTIC STUDIES: Oxygen Saturation is 97% on RA, normal by my interpretation.    COORDINATION OF CARE: 5:23 PM Discussed treatment plan with pt at bedside and pt agreed to plan.   Labs Review  Labs Reviewed - No data to display  Imaging Review Dg Chest 2 View  06/30/2014   CLINICAL DATA:  Thrown from a horse 2 days ago. Chest pain with inspiration. Pain in the left humerus and hip.  EXAM: CHEST - 2 VIEW  COMPARISON:  CT chest with contrast 10/21/2005.  FINDINGS: The heart size and mediastinal contours are within normal limits. Both lungs are clear. The visualized skeletal structures are unremarkable. The left humerus high MRI odd is again noted.  IMPRESSION: No active disease.   Electronically Signed   By: San Morelle M.D.   On: 06/30/2014 16:56   Dg Lumbar Spine Complete  06/30/2014   CLINICAL DATA:  Low back pain after being thrown from a horse 2 days ago.  EXAM: LUMBAR SPINE - COMPLETE 4+ VIEW  COMPARISON:  02/16/2013.  FINDINGS: There is no evidence of lumbar spine fracture. Alignment is normal. Intervertebral disc spaces are maintained.  IMPRESSION: Normal examination.   Electronically Signed   By: Claudie Revering M.D.   On: 06/30/2014 16:50   Dg Humerus Left  06/30/2014   CLINICAL DATA:  Upper left arm pain since being thrown from a horse 2 days ago. Left humerus surgery in November 2014.  EXAM: LEFT HUMERUS - 2+ VIEW  COMPARISON:  02/16/2013.  FINDINGS: Interval intramedullary rod and screw fixation of the previously demonstrated left humerus fracture. Anatomic position and alignment with interval bridging callus. No  acute fracture or dislocation seen.  IMPRESSION: No acute fracture.   Electronically Signed   By: Claudie Revering M.D.   On: 06/30/2014 16:48   Dg Hip Unilat With Pelvis 2-3 Views Left  06/30/2014   CLINICAL DATA:  Left hip pain after being thrown from a horse 2 days ago.  EXAM: LEFT HIP (WITH PELVIS) 2-3 VIEWS  COMPARISON:  None.  FINDINGS: Normal appearing hips, pelvic bones and lower lumbar spine. No fracture or dislocation seen.  IMPRESSION: Normal examination.   Electronically Signed   By: Claudie Revering M.D.   On: 06/30/2014 16:49     EKG Interpretation None      BP 115/75 mmHg  Pulse 91  Temp(Src) 97.7 F (36.5 C) (Oral)  Resp 22  SpO2 97%  LMP 06/30/2014  Patient informed of imaging results. Will treat in emergency department with Vicodin and Toradol. Will give Vicodin, Robaxin, naproxen for home. Patient to follow-up with her orthopedic physician for recheck when she is able to get an appointment.  Patient urged to return with worsening symptoms or other concerns. Patient verbalized understanding and agrees with plan.     MDM   Final diagnoses:  Fall, initial encounter  Contusion   Patient with fall from horse 2 days ago. Painful areas have been x-rayed and are negative. Do not suspect occult fracture. Extremities are neurovascularly intact. No signs of pulmonary contusion or pneumothorax on x-ray. Patient is ambulatory.  Patient with positive loss of consciousness initially. She has not had any neurologic decompensation for greater than 48 hours. Do not feel that head CT is indicated at this time.  I personally performed the services described in this documentation, which was scribed in my presence. The recorded information has been reviewed and is accurate.      Carlisle Cater, PA-C 06/30/14 [place], MD 07/01/14 [phone]

## 2014-08-06 NOTE — H&P (Signed)
PATIENT NAME:  [name], POLGAR MR#:  [id number] DATE OF BIRTH:  Mar 30, 1986  DATE OF ADMISSION:  02/21/2013  REASON FOR ADMISSION:  Possible seizure.   REFERRING PHYSICIAN:  Charlesetta Ivory, MD.   PRIMARY CARE PHYSICIAN:  None.   HISTORY OF PRESENT ILLNESS:  This is a very nice 29 year old female who has history of a work accident happening this last Wednesday in which she was operating machinery at work. This is her normal activity, her normal duty. There was a malfunction apparently that pulled her into the machine and hit her really hard. Her arm got wrapped into a cylinder and she had a significant fracture of her left humerus. For that reason the patient was taken to the OR and had an open reduction and internal fixation with a rod done on Wednesday at [facility]. The patient states that since then she is doing a lot of pain, taking Percocet, is starting to take Phenergan and mixing it with Benadryl. She does not remember much of the events except for going to bed last night. What has been described to me by the boyfriend is that he woke up around 1:30 in the morning, looked down into the side of the bed where she sleeps, and she was not there. She was at that moment on the floor, and she was moaning and fighting and talking in her sleep. She was moving a lot but there were not any tonic-clonic movements that could look like a seizure. Again she was fighting them whenever they were trying to wake her up. There was no incontinent urine or feces but the patient has a bite on her tongue at the level of the right side that was bleeding. After the patient calmed down, she had slowed down and was more corporative. She still does not remember anything up to the point that she woke up in the ambulance. While she has been here in the ER, she has been having some twitching at the level of the mouth, arms and legs, which the boyfriend describes as normal sleeping pattern for her. The patient has been taking 4 to 6  Percocets a day and she took some high-dose Phenergan yesterday and Benadryl. Other than that she does not remember anything and she is a little bit drowsy, wants to asleep today but once she wakes up, she is able to give good report with good answers to questions. The patient admitted for observation and evaluation of possible seizures.   REVIEW OF SYSTEMS:  CONSTITUTIONAL:  No fever, fatigue, weakness, or weight loss.  EYES:  No blurry vision, double vision.  EARS, NOSE, THROAT:  No tinnitus. No difficulty swallowing. No epistaxis.  RESPIRATORY:  No cough, wheezing, or hemoptysis.  CARDIOVASCULAR:  No chest pain, orthopnea or dizziness.  GASTROINTESTINAL:  No nausea, vomiting, abdominal pain or constipation. She takes Phenergan for nausea with the pain medications but she has not had any nausea today.  GENITOURINARY:  No dysuria, hematuria, or changes in frequency.  GYNECOLOGIC:  No breast masses.  ENDOCRINE:  No polyuria, polydipsia, polyphagia, cold or heat intolerance.  HEMATOLOGIC AND LYMPHATIC:  No anemia, easy bruising or bleeding.  MUSCULOSKELETAL:  Positive neck pain after the accident, left upper extremity pain, back pain, all those things are related to the recent accident. There is some and numbness at the level of the left upper extremity, which is likely due to the Ace band wrap that she has around.  NEUROLOGIC:  No numbness on anywhere else, just in  the left upper extremity from the ace band and positive for possible seizure. No CVAs, no TIAs. No vertigo or ataxia.  PSYCHIATRIC:  No anxiety and depression at this moment. The patient has been treated for depression in the past with Depakote when she apparently had a rash.   PAST MEDICAL HISTORY:   1.  The patient states that she is healthy. Does not take any medications.  2.  She has history of remote depression.  3.  Left humerus accidental injury at work.   ALLERGIES:  The patient states DEPAKOTE GIVES HER HIVES. She was taking  it for depression.   PAST SURGICAL HISTORY:  Left humerus open reduction and internal fixation, and wisdom teeth surgery.   FAMILY HISTORY:  Seizures in grandmother, denies any other important history.  MEDICATIONS:  Percocet and Phenergan, vitamin D, Benadryl and ibuprofen.   SOCIAL HISTORY:  She smokes 1/2 pack a day. She has been smoking that since over five years. She drinks rarely. She does not use drugs. She lives with her boyfriend.  PHYSICAL EXAMINATION: VITAL SIGNS:  Blood pressure 111/76, pulse 73, respirations 14, temperature 98.8, oxygen saturation 99% on room air.  GENERAL:  Alert, oriented x 3, in no acute distress. No respiratory distress. She occasionally moans for pain but overall she is comfortable. She goes back to sleep after we stopped talking. She is asleep whenever I entered the room but she woke up appropriately and she is alert, oriented x 3. HEENT:  Pupils are equal and reactive. Extraocular movements are intact. Mucosae are dry. Anicteric sclerae. Pink conjunctivae. Positive at the level of the right base of the tongue from a bite.  NECK:  Supple. No JVD. No thyromegaly. No adenopathy. No carotid bruits. No rigidity. At this moment, the patient states it hurts on the sternocleidomastoid  and trapezius muscles after her accident, but there is no significant rigidity. The trachea central.  RESPIRATORY:  Good respiratory effort.  LUNGS:  Clear to auscultation, symmetrical expansion.  CARDIOVASCULAR:  Regular rate and rhythm. No murmurs, rubs or gallops.  VASCULAR:  Pulses +2. Capillary refill less than 3.  GASTROINTESTINAL:  Abdomen soft, nontender, nondistended. No hepatosplenomegaly. No masses. Bowel sounds are positive.  GENITOURINARY:  Deferred.  EXTREMITIES:  No edema, cyanosis or clubbing of the lower extremities. There is some mild edema at the level of dorsum of the hand, which is secondary to Ace compression band at the level of the left upper extremity. The  patient has decreased sensation of the fingers, which is due to the same reason. Capillary refill less than 3, and there is an Ace band wrap all around her arm. There is an incision at the level of the upper arm, which is clean.  MUSCULOSKELETAL:  No back deformity. No neck deformity.  SKIN:  No rashes, petechiae or new lesions.  LYMPHATIC:  Negative for lymphadenopathy in the neck or supraclavicular areas.  NEUROLOGIC:  Cranial nerves II through XII intact At this moment. Strength seems to be equal for lower extremities, difficult to assess symmetry on upper extremities due to her surgical problem, immobilization of the arm. PSYCHIATRIC:  The patient seems to be an appropriate with no significant agitation. Alert, oriented x 3.   LABORATORY, DIAGNOSTIC AND RADIOLOGICAL DATA:  Potassium is 3.4, chloride 108, creatinine 0.76. White blood cells 7.3, hemoglobin 12, platelet count 208. White count on urine is 4. No other signs of urinary infection. Pregnancy test is negative.   CT scan of the head shows  no evidence of acute intracranial process. CT scan of the cervical spine:  No acute fracture within the cervical spine.   CHEST X-RAY:  No acute pulmonary process.   ASSESSMENT AND PLAN:  A 29 year old female with history of being overall healthy, admitted for a possible seizure. 1.  Possible seizure. The patient had an episode that does not necessarily look like a seizure, but she was really postictal. She bit her tongue. There were no tonic-clonic movements, but the patient was fighting and having what appeared to be a hallucination.  2.  Differential diagnosis also includes acute delirium, which could be secondary to the mix of medications, narcotics with Phenergan and Benadryl. These medications could react and give her hallucinations and agitation. At this moment, the patient seems very calm. We are going to stop Benadryl, stop Phenergan, treat the nausea with Zofran. Since there is a possibility of  seizures, we are going to try to get an EEG and a Neurology consult. I am not quite sure if the EEGs are done on the weekends, but if it is not possible, we can schedule an outpatient. We are going to try to get Neurology to see her today.  3.  The patient has significant pain for which we are going to continue Percocet. We are going to order a UDS and a TSH to evaluate other possibilities of this seizure disorder.  4.  The sleep depravation could be a reason why she could have a decrease of her threshold for seizures for now, and we are going treat it with Keppra and add on some p.r.n. benzodiazepines, if necessary. The patient needs to sleep. We are going to give her some rest today.  5.  Deep vein thrombosis prophylaxis with Lovenox as the patient just recently had surgery and has the risk of deep vein thrombosis as she is not moving. 6.  Gastrointestinal prophylaxis with Protonix.   TIME SPENT:  I spent about 45 minutes with this patient.   ____________________________ [place] Sink, MD rsg:jm D: 02/21/2013 09:22:38 ET T: 02/21/2013 09:57:57 ET JOB#: [id number]  cc: [place] Sink, MD, <Dictator> Shelle Galdamez America Brown MD ELECTRONICALLY SIGNED 03/04/2013 22:59

## 2014-08-06 NOTE — Discharge Summary (Signed)
PATIENT NAME:  [name], [name] MR#:  [id number] DATE OF BIRTH:  Aug 25, 1985  DATE OF ADMISSION:  02/21/2013 DATE OF DISCHARGE:  02/22/2013  REASON FOR ADMISSION: Possible seizure.   DISCHARGE:  Remains the same.   MEDICATIONS AT DISCHARGE: Percocet 5/325 mg q. 8 hours p.r.n. pain,  Keppra 500 mg every 12 hours.   CONSULTANTS: Dr. Jennings Books   DISPOSITION: Home. Return to work once she has cleared up a workers comp.   FOLLOW-UP:  Manuella Ghazi for treatment of seizures and possible EEG as an outpatient.   HOSPITAL COURSE: A 29 year old female with history of recent accident at work where she was pulled into a machine and had a fracture of her humerus. Her accident was on 02/16/2013. After the humerus fracture was diagnosed, the patient required surgery at [facility]. She was given pain medications and discharged 02/20/2013. The patient went to her house took her pain medications and some other over-the-counter medications including Benadryl. She also had some Phenergan. She went to bed around 1:30 to 2:30. The boyfriend did not find her bed. She was on the floor face down. She was having some movements of her arms and her legs.  After he tried to wake her up, she fought him and was talking in her sleep, hallucinating. She did bite the right side of her tongue laterally. The patient did not have any loss of bowel or bladder and she was brought to the Emergency Department. She did not recall most of her events. But on the evaluation, the patient took Benadryl, several tablets that day, her pain medications several tablets that day. Whenever I ask her she said that she does not take more than two a day, whenever I asked to boyfriend the boyfriend said she takes 6 to 8 or more. The patient took some Phenergan before going to sleep and she denied the use of drugs, but her drug screen was positive marijuana/THC. The patient was loaded with Keppra and put on Keppra p.o. MRI of the brain was done, which came up  normal.   LABORATORY RESULTS: EEG was ordered, but unfortunately was not able to be completed during the weekend. Neurology evaluation was done with Dr. Jennings Books, who did not think that this was a seizure and if it was, was a provoked seizure due to the medications, and questionable head trauma plus sleep deprivation. Some of features of the spell where nonepileptic and they did not seem to be again seizure. Dr. Jennings Books located the family and the mother and the mother insisted that she needed to be on medications because  her grandmother had seizures and that could run the family. Dr. Jennings Books did his best to try to explain to the patient it was not indicated but after a long conversation and they decided to leave her on medications for now. An MRI of the brain was done and again was normal. I went to talk to the family as mother was really upset about her daughter being in the hospital. When I talked to them explained the situation and told them what we thought it was going on. The mother was asking for results of her testing. When I asked the patient if she wanted me to share the results with her mother, she was a little bit upset with her mother and apparently when I checked with her boyfriend, her mother me as well, but she worries too much, and upsets the patient a lot. The patient was definitely irritated whenever we  talked and when I ask her all of the questions she was really irritated but she was alert and oriented x 3 and able to make her decisions as quick Mini-Mental exam demonstrated. The patient was asked directly if she wanted me to hold  information to her mom. She said no specifically and then I asked her specifically  if she wanted me to tell her the results of all the testing including the positive drug screen. The patient says it was okay to share that information. Whenever I shared the information and I told them that  she was positive for marijuana, the patient got really upset and  started screaming and saying that we were blaming her for things that she has not done and that is not true that she has been using drugs . I explained to her that I never said that she was or was not, that I only shared the results as they asked me to.  The patient again got really upset, keeps screaming, throwing a fit, threatening to leave. We authorized her to live if she wants to leave [place] we could not hold her against her will, but the patient asked her mom to get some of her clothes. The patient first told me that she does not do marijuana that her boyfriend does and then she changed the story and she told me that she was on a party on Halloween over a week ago and that there might be some people smoking marijuana there. It has been over a week and the patient is still high positive for what I cannot make the assumption  that she has been using more. The patient was counseled by Dr. Manuella Ghazi. She decided to stay overnight for an MRI. After the MRI was done went to check on the patient was gone from the floor for over two hours. I  physically went to look for her on the campus with the nurses and we were not able to find her.  I was able to contact boyfriend. I spoke with her boyfriend at length. He said that the patient was just really upset and still really upset with her mother and the events from yesterday from her conversation about the marijuana. I continued to try to find her and I was never able to find her.  Finally she went back to the room and whenever I had my prescription to go talk to her, she states that she did not want to talk to me. I gave her a prescription and told her with the nurse, that Dr. Jennings Books will suggest not to be on medications, but he will be okay to prescribe it for a short period of time until the evaluation is complete. The patient is discharged in good condition.   TIME SPENT: I spent about 70 to 75 minutes with this patient today on this  discharge.  ____________________________ [place] Sink, MD rsg:cc D: 02/22/2013 14:45:59 ET T: 02/22/2013 19:10:30 ET JOB#: [id number]  cc: [place] Sink, MD, <Dictator> Zyere Jiminez America Brown MD ELECTRONICALLY SIGNED 02/23/2013 6:43

## 2014-08-06 NOTE — Consult Note (Signed)
Brief Consult Note: Diagnosis: status post IM rod fixation of left humeral shaft fracture.   Patient was seen by consultant.   Recommend further assessment or treatment.   Orders entered.   Comments: Patient had surgery on Wednesday by Dr. Berenice Primas in [place] for a left humeral shaft fracture sustained at work.  Patient admitted for possible seizure last night.  I have been asked by Dr. Laurin Coder to evaluate the patient's left arm.  Patient has pain in left arm and states that she feels numbness in her fingers globally.  ON exam, her hand is well perfused.  She has swelling in the left hand, but her compartments are soft and compressible.  She has a palpable radial pulse and she can feel me touching her fingers.  Her left arm bandages are clean and dry.  There is no deformity to the upper arm.  I ordered and reviewed xrays of the left humerus which show the fracture is well aligned and the rod is in good position.  Patient should continue current pain management.  I ordered ibuprofen 600mg  TID to help swelling.  She should elevate her hand and forearm on pillows and ice can be applied to the hand and forearm to reduce edema.  She will follow up with her surgeon as scheduled in 7 - 10 days.  Electronic Signatures for Addendum Section:  Thornton Park (MD) (Signed Addendum [phone] 19:17)  I explained to the patient that the numbness is likely related to the swelling following the injury and surgery.  This will likely improve as the swelling decreases over time.  There is no intervention needed at this time.  I will continue to follow while patient is in the hospital.  Dr. Berenice Primas will follow the patient after discharge.   Electronic Signatures: Thornton Park (MD)  (Signed [id number] 19:15)  Authored: Brief Consult Note   Last Updated: 08-Nov-14 19:17 by Thornton Park (MD)
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
## 2016-01-23 ENCOUNTER — Encounter (HOSPITAL_COMMUNITY): Payer: Self-pay

## 2020-01-06 ENCOUNTER — Other Ambulatory Visit: Payer: Self-pay

## 2020-01-06 ENCOUNTER — Emergency Department (HOSPITAL_COMMUNITY)
Admission: EM | Admit: 2020-01-06 | Discharge: 2020-01-06 | Disposition: A | Discharge status: Home / Self Care | Payer: Self-pay | Attending: Emergency Medicine | Admitting: Emergency Medicine

## 2020-01-06 ENCOUNTER — Encounter (HOSPITAL_COMMUNITY): Payer: Self-pay | Admitting: Emergency Medicine

## 2020-01-06 DIAGNOSIS — N390 Urinary tract infection, site not specified: Secondary | ICD-10-CM | POA: Insufficient documentation

## 2020-01-06 DIAGNOSIS — L237 Allergic contact dermatitis due to plants, except food: Secondary | ICD-10-CM | POA: Insufficient documentation

## 2020-01-06 DIAGNOSIS — F17200 Nicotine dependence, unspecified, uncomplicated: Secondary | ICD-10-CM | POA: Insufficient documentation

## 2020-01-06 HISTORY — DX: Unspecified convulsions: R56.9

## 2020-01-06 LAB — CBC WITH DIFFERENTIAL/PLATELET
Abs Immature Granulocytes: 0.02 10*3/uL (ref 0.00–0.07)
Basophils Absolute: 0 10*3/uL (ref 0.0–0.1)
Basophils Relative: 1 %
Eosinophils Absolute: 0.2 10*3/uL (ref 0.0–0.5)
Eosinophils Relative: 2 %
HCT: 43.5 % (ref 36.0–46.0)
Hemoglobin: 14.3 g/dL (ref 12.0–15.0)
Immature Granulocytes: 0 %
Lymphocytes Relative: 30 %
Lymphs Abs: 2 10*3/uL (ref 0.7–4.0)
MCH: 28.9 pg (ref 26.0–34.0)
MCHC: 32.9 g/dL (ref 30.0–36.0)
MCV: 88.1 fL (ref 80.0–100.0)
Monocytes Absolute: 0.4 10*3/uL (ref 0.1–1.0)
Monocytes Relative: 6 %
Neutro Abs: 4.2 10*3/uL (ref 1.7–7.7)
Neutrophils Relative %: 61 %
Platelets: 227 10*3/uL (ref 150–400)
RBC: 4.94 MIL/uL (ref 3.87–5.11)
RDW: 13.1 % (ref 11.5–15.5)
WBC: 6.8 10*3/uL (ref 4.0–10.5)
nRBC: 0 % (ref 0.0–0.2)

## 2020-01-06 LAB — COMPREHENSIVE METABOLIC PANEL
ALT: 23 U/L (ref 0–44)
AST: 23 U/L (ref 15–41)
Albumin: 3.8 g/dL (ref 3.5–5.0)
Alkaline Phosphatase: 63 U/L (ref 38–126)
Anion gap: 10 (ref 5–15)
BUN: <5 mg/dL — ABNORMAL LOW (ref 6–20)
CO2: 25 mmol/L (ref 22–32)
Calcium: 8.8 mg/dL — ABNORMAL LOW (ref 8.9–10.3)
Chloride: 104 mmol/L (ref 98–111)
Creatinine, Ser: 0.78 mg/dL (ref 0.44–1.00)
GFR calc Af Amer: >60 mL/min (ref >60)
GFR calc non Af Amer: >60 mL/min (ref >60)
Glucose, Bld: 99 mg/dL (ref 70–99)
Potassium: 3.8 mmol/L (ref 3.5–5.1)
Sodium: 139 mmol/L (ref 135–145)
Total Bilirubin: 0.5 mg/dL (ref 0.3–1.2)
Total Protein: 5.8 g/dL — ABNORMAL LOW (ref 6.5–8.1)

## 2020-01-06 LAB — URINALYSIS, ROUTINE W REFLEX MICROSCOPIC
Bilirubin Urine: NEGATIVE
Glucose, UA: NEGATIVE mg/dL
Hgb urine dipstick: NEGATIVE
Ketones, ur: NEGATIVE mg/dL
Nitrite: NEGATIVE
Protein, ur: NEGATIVE mg/dL
Specific Gravity, Urine: 1.01 (ref 1.005–1.030)
pH: 8 (ref 5.0–8.0)

## 2020-01-06 LAB — RAPID URINE DRUG SCREEN, HOSP PERFORMED
Amphetamines: NOT DETECTED
Barbiturates: NOT DETECTED
Benzodiazepines: POSITIVE — AB
Cocaine: NOT DETECTED
Opiates: NOT DETECTED
Tetrahydrocannabinol: POSITIVE — AB

## 2020-01-06 LAB — ETHANOL: Alcohol, Ethyl (B): <10 mg/dL (ref <10)

## 2020-01-06 MED ORDER — METHYLPREDNISOLONE SODIUM SUCC 125 MG IJ SOLR
125.0000 mg | Freq: Once | INTRAMUSCULAR | Status: AC
  Administered 2020-01-06: 125 mg via INTRAMUSCULAR
  Filled 2020-01-06: qty 2

## 2020-01-06 MED ORDER — CEPHALEXIN 500 MG PO CAPS: 500.0000 mg | ORAL_CAPSULE | Freq: Four times a day (QID) | ORAL | 0 refills | Status: AC
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
## 2020-01-06 NOTE — ED Provider Notes (Addendum)
[facility] EMERGENCY DEPARTMENT Provider Note   CSN: [id number] Arrival date & time: 01/06/20  1619     History Chief Complaint  Patient presents with  . Breast Mass  . Rash    [name] is a 34 y.o. female.  Pt had an episode of unresponsiveness.  Rn reports pt jerking.  Pt has a histroy of seizures.  She does not have any medication listed. Pt returned to baseline quickly.  Pt reports she thinks she has had poison ivy.  Pt reports she has multiple areas of a linear rash all over her.   The history is provided by the patient. No language interpreter was used.       Past Medical History:  Diagnosis Date  . Depression   . Fracture, humerus closed    Left  . Seizures ([place])     There are no problems to display for this patient.   Past Surgical History:  Procedure Laterality Date  . HUMERUS IM NAIL Left 02/18/2013   Procedure: LEFT HUMERAL IM ROD (ANTEGRADE);  Surgeon: Alta Corning, MD;  Location: [facility];  Service: Orthopedics;  Laterality: Left;  . WISDOM TOOTH EXTRACTION       OB History   No obstetric history on file.     No family history on file.  Social History   Tobacco Use  . Smoking status: Current Every Day Smoker  Substance Use Topics  . Alcohol use: Yes    Comment: social  . Drug use: No    Home Medications Prior to Admission medications   Medication Sig Start Date End Date Taking? Authorizing Provider  amoxicillin (AMOXIL) 500 MG capsule Take 500 mg by mouth 3 (three) times daily. Started on 01-19-14 for 10 day therapy    [provider]  amoxicillin (AMOXIL) 500 MG capsule Take 1 capsule (500 mg total) by mouth 3 (three) times daily. 01/19/14   Billy Fischer, MD  CLONAZEPAM PO Take 1 mg by mouth 2 (two) times daily as needed (anxiety).     [provider]  diclofenac (CATAFLAM) 50 MG tablet Take 50 mg by mouth 3 (three) times daily.    [provider]  diclofenac  (CATAFLAM) 50 MG tablet Take 1 tablet (50 mg total) by mouth 3 (three) times daily. 01/19/14   Billy Fischer, MD  FLUoxetine (PROZAC) 20 MG capsule Take 20 mg by mouth daily.    [provider]  FLUOXETINE HCL PO Take 40 mg by mouth every morning.     [provider]  HYDROcodone-acetaminophen (NORCO/VICODIN) 5-325 MG per tablet Take 1-2 tablets every 6 hours as needed for severe pain 06/30/14   Carlisle Cater, PA-C  methocarbamol (ROBAXIN) 500 MG tablet Take 1 tablet (500 mg total) by mouth 4 (four) times daily. 06/30/14   Carlisle Cater, PA-C  naproxen (NAPROSYN) 500 MG tablet Take 1 tablet (500 mg total) by mouth 2 (two) times daily. 06/30/14   Carlisle Cater, PA-C  oxyCODONE-acetaminophen (PERCOCET/ROXICET) 5-325 MG per tablet Take 1 tablet by mouth every 4 (four) hours as needed for severe pain. 01/21/14   Renne Musca, MD  QUEtiapine Fumarate (SEROQUEL PO) Take 50 mg by mouth at bedtime.     [provider]  zolpidem (AMBIEN) 5 MG tablet Take 5 mg by mouth at bedtime as needed for sleep.    [provider]    Allergies    Depakote [divalproex sodium] and Divalproex sodium  Review of  Systems   Review of Systems  All other systems reviewed and are negative.   Physical Exam Updated Vital Signs BP 126/87 (BP Location: Left Arm)   Pulse 72   Temp 98.4 F (36.9 C) (Oral)   Resp 18   Ht 4\' 11"  (1.499 m)   Wt 81.6 kg   LMP 12/28/2019   SpO2 98%   BMI 36.36 kg/m   Physical Exam Vitals and nursing note reviewed.  Constitutional:      Appearance: She is well-developed.  HENT:     Head: Normocephalic.  Eyes:     Extraocular Movements: Extraocular movements intact.     Conjunctiva/sclera: Conjunctivae normal.     Pupils: Pupils are equal, round, and reactive to light.     Comments: Blink intact   Cardiovascular:     Rate and Rhythm: Normal rate.  Pulmonary:     Effort: Pulmonary effort is normal.  Abdominal:     General: There is no  distension.  Musculoskeletal:        General: Normal range of motion.     Cervical back: Normal range of motion.  Skin:    General: Skin is warm.     Comments: Linear red raised rash  Neurological:     General: No focal deficit present.     Mental Status: She is alert and oriented to person, place, and time.  Psychiatric:        Mood and Affect: Mood normal.     ED Results / Procedures / Treatments   Labs (all labs ordered are listed, but only abnormal results are displayed) Labs Reviewed  CBC WITH DIFFERENTIAL/PLATELET  COMPREHENSIVE METABOLIC PANEL  URINALYSIS, ROUTINE W REFLEX MICROSCOPIC  RAPID URINE DRUG SCREEN, HOSP PERFORMED  ETHANOL    EKG None  Radiology No results found.  Procedures Procedures (including critical care time)  Medications Ordered in ED Medications - No data to display  ED Course  I have reviewed the triage vital signs and the nursing notes.  Pertinent labs & imaging results that were available during my care of the patient were reviewed by me and considered in my medical decision making (see chart for details).    MDM Rules/Calculators/A&P                          MDM:  Labs ordered.  Dr. Sherry Ruffing in to see and examine.   Pt states she recently moved here.  No local MD. No neurologist.  Pt is not on a seizure drug.   Final Clinical Impression(s) / ED Diagnoses Final diagnoses:  Poison ivy  Urinary tract infection without hematuria, site unspecified    Rx / DC Orders ED Discharge Orders         Ordered    cephALEXin (KEFLEX) 500 MG capsule  4 times daily        01/06/20 2030           Sidney Ace 01/06/20 1841    Fransico Meadow, PA-C 01/06/20 2031    Tegeler, Gwenyth Allegra, MD 01/06/20 2113

## 2020-01-06 NOTE — ED Notes (Signed)
Pt had seizure- like activity while in bed. Pupils responsive entire time. MD and PA at bedside.

## 2020-01-06 NOTE — ED Notes (Signed)
Pt said she was going to put her wallet in car a few min ago and has not returned yet.

## 2020-02-01 ENCOUNTER — Other Ambulatory Visit: Payer: Self-pay

## 2020-02-01 DIAGNOSIS — N6310 Unspecified lump in the right breast, unspecified quadrant: Secondary | ICD-10-CM

## 2020-02-03 ENCOUNTER — Ambulatory Visit: Payer: Self-pay | Admitting: *Deleted

## 2020-02-03 ENCOUNTER — Other Ambulatory Visit: Payer: Self-pay

## 2020-02-03 VITALS — BP 118/76 | Temp 98.0°F | Wt 184.0 lb

## 2020-02-03 DIAGNOSIS — N6322 Unspecified lump in the left breast, upper inner quadrant: Secondary | ICD-10-CM

## 2020-02-03 DIAGNOSIS — N6313 Unspecified lump in the right breast, lower outer quadrant: Secondary | ICD-10-CM

## 2020-02-03 DIAGNOSIS — Z01419 Encounter for gynecological examination (general) (routine) without abnormal findings: Secondary | ICD-10-CM

## 2020-02-03 NOTE — Patient Instructions (Addendum)
Explained breast self awareness with Nicholaus Bloom. Pap smear completed. Let patient know that if today's Pap smear is normal and HPV negative that her next Pap smear is due in one year due to her last Pap smear was abnormal. Referred patient to the [facility] for a diagnostic mammogram. Appointment scheduled Thursday, February 04, 2020 at [facility]. Patient aware of appointment and will be there. Let patient know that we will follow-up with her within the next couple of weeks with results of her Pap smear by letter or phone. Discussed smoking cessation with patient. Referred to the [facility] Quitline and gave resources to the free smoking cessation classes at [facility] [name] verbalized understanding.  [name], Arvil Chaco, RN 11:57 AM
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
## 2020-02-04 ENCOUNTER — Other Ambulatory Visit: Payer: Self-pay | Admitting: Obstetrics and Gynecology

## 2020-02-04 ENCOUNTER — Ambulatory Visit
Admission: RE | Admit: 2020-02-04 | Discharge: 2020-02-04 | Disposition: A | Discharge status: Home / Self Care | Payer: No Typology Code available for payment source | Source: Ambulatory Visit | Attending: Obstetrics and Gynecology | Admitting: Obstetrics and Gynecology

## 2020-02-04 ENCOUNTER — Ambulatory Visit
Admission: RE | Admit: 2020-02-04 | Discharge: 2020-02-04 | Disposition: A | Discharge status: Home / Self Care | Payer: Medicaid Other | Source: Ambulatory Visit | Attending: Obstetrics and Gynecology | Admitting: Obstetrics and Gynecology

## 2020-02-04 DIAGNOSIS — N6310 Unspecified lump in the right breast, unspecified quadrant: Secondary | ICD-10-CM

## 2020-02-05 ENCOUNTER — Telehealth: Payer: Self-pay

## 2020-02-05 ENCOUNTER — Telehealth: Payer: Self-pay | Admitting: *Deleted

## 2020-02-05 LAB — CYTOLOGY - PAP
Comment: NEGATIVE
Diagnosis: UNDETERMINED — AB
High risk HPV: NEGATIVE

## 2020-02-05 NOTE — Telephone Encounter (Signed)
Received voice message from 10/21 am that she is calling for pap results.  Per chart review is not a patient in our office. Has pap with [facility] will forward to that office. Drew Lips,RN

## 2020-02-05 NOTE — Progress Notes (Signed)
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 Thank you,  Saagar Tortorella

## 2020-02-05 NOTE — Telephone Encounter (Signed)
Charissa called and left another message this am about getting her pap results and if she doesn't answer - may leave message. Will forward to [facility] again Jatniel Verastegui,RN

## 2020-02-05 NOTE — Telephone Encounter (Signed)
Patient informed Pap results, ASC-US with negative HPV, Needs to repeat pap within 1 year. Patient verbalized understanding.

## 2020-02-05 NOTE — Telephone Encounter (Signed)
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 Thank you,  Caitlen Worth

## 2020-02-15 ENCOUNTER — Ambulatory Visit
Admission: RE | Admit: 2020-02-15 | Discharge: 2020-02-15 | Disposition: A | Discharge status: Home / Self Care | Payer: No Typology Code available for payment source | Source: Ambulatory Visit | Attending: Obstetrics and Gynecology | Admitting: Obstetrics and Gynecology

## 2020-02-15 ENCOUNTER — Other Ambulatory Visit: Payer: Self-pay

## 2020-02-15 DIAGNOSIS — N6310 Unspecified lump in the right breast, unspecified quadrant: Secondary | ICD-10-CM

## 2020-02-17 ENCOUNTER — Ambulatory Visit: Payer: Self-pay | Admitting: Surgery

## 2020-02-17 DIAGNOSIS — D241 Benign neoplasm of right breast: Secondary | ICD-10-CM

## 2020-02-17 DIAGNOSIS — D4861 Neoplasm of uncertain behavior of right breast: Secondary | ICD-10-CM
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
## 2020-03-03 ENCOUNTER — Other Ambulatory Visit: Payer: Self-pay | Admitting: Surgery

## 2020-03-03 DIAGNOSIS — D241 Benign neoplasm of right breast: Secondary | ICD-10-CM

## 2020-03-31 ENCOUNTER — Encounter (HOSPITAL_BASED_OUTPATIENT_CLINIC_OR_DEPARTMENT_OTHER): Payer: Self-pay | Admitting: Surgery

## 2020-03-31 ENCOUNTER — Other Ambulatory Visit: Payer: Self-pay

## 2020-04-01 ENCOUNTER — Other Ambulatory Visit (HOSPITAL_COMMUNITY)
Admission: RE | Admit: 2020-04-01 | Discharge: 2020-04-01 | Disposition: A | Discharge status: Home / Self Care | Payer: HRSA Program | Source: Ambulatory Visit | Attending: Surgery | Admitting: Surgery

## 2020-04-01 DIAGNOSIS — Z01812 Encounter for preprocedural laboratory examination: Secondary | ICD-10-CM | POA: Diagnosis present

## 2020-04-01 DIAGNOSIS — Z20822 Contact with and (suspected) exposure to covid-19: Secondary | ICD-10-CM | POA: Insufficient documentation

## 2020-04-01 LAB — SARS CORONAVIRUS 2 (TAT 6-24 HRS): SARS Coronavirus 2: NEGATIVE

## 2020-04-01 NOTE — Progress Notes (Signed)
Pt's past medical history reviewed with Dr W.Fitzgerald. Pt states that she has not had any seizure/ spells since September.  Pt instructed during the pre op call to resume her Trileptal and take it up to and on the day of surgery. No further clearance needed prior to surgery.

## 2020-04-04 ENCOUNTER — Ambulatory Visit
Admission: RE | Admit: 2020-04-04 | Discharge: 2020-04-04 | Disposition: A | Discharge status: Home / Self Care | Payer: No Typology Code available for payment source | Source: Ambulatory Visit | Attending: Surgery | Admitting: Surgery

## 2020-04-04 ENCOUNTER — Other Ambulatory Visit: Payer: Self-pay

## 2020-04-04 ENCOUNTER — Other Ambulatory Visit: Payer: Self-pay | Admitting: Surgery

## 2020-04-04 DIAGNOSIS — D4861 Neoplasm of uncertain behavior of right breast: Secondary | ICD-10-CM

## 2020-04-04 DIAGNOSIS — D241 Benign neoplasm of right breast: Secondary | ICD-10-CM

## 2020-04-04 NOTE — Progress Notes (Signed)

## 2020-04-05 ENCOUNTER — Ambulatory Visit (HOSPITAL_BASED_OUTPATIENT_CLINIC_OR_DEPARTMENT_OTHER): Payer: No Typology Code available for payment source | Admitting: Anesthesiology

## 2020-04-05 ENCOUNTER — Encounter (HOSPITAL_BASED_OUTPATIENT_CLINIC_OR_DEPARTMENT_OTHER)
Admission: RE | Disposition: A | Discharge status: Home / Self Care | Payer: Self-pay | Source: Home / Self Care | Attending: Surgery

## 2020-04-05 ENCOUNTER — Ambulatory Visit
Admission: RE | Admit: 2020-04-05 | Discharge: 2020-04-05 | Disposition: A | Discharge status: Home / Self Care | Payer: No Typology Code available for payment source | Source: Ambulatory Visit | Attending: Surgery | Admitting: Surgery

## 2020-04-05 ENCOUNTER — Ambulatory Visit (HOSPITAL_BASED_OUTPATIENT_CLINIC_OR_DEPARTMENT_OTHER)
Admission: RE | Admit: 2020-04-05 | Discharge: 2020-04-05 | Disposition: A | Discharge status: Home / Self Care | Payer: No Typology Code available for payment source | Attending: Surgery | Admitting: Surgery

## 2020-04-05 ENCOUNTER — Other Ambulatory Visit: Payer: Self-pay

## 2020-04-05 ENCOUNTER — Encounter (HOSPITAL_BASED_OUTPATIENT_CLINIC_OR_DEPARTMENT_OTHER): Payer: Self-pay | Admitting: Surgery

## 2020-04-05 DIAGNOSIS — Z808 Family history of malignant neoplasm of other organs or systems: Secondary | ICD-10-CM | POA: Insufficient documentation

## 2020-04-05 DIAGNOSIS — D241 Benign neoplasm of right breast: Secondary | ICD-10-CM

## 2020-04-05 DIAGNOSIS — N6313 Unspecified lump in the right breast, lower outer quadrant: Secondary | ICD-10-CM | POA: Insufficient documentation

## 2020-04-05 DIAGNOSIS — N6312 Unspecified lump in the right breast, upper inner quadrant: Secondary | ICD-10-CM | POA: Insufficient documentation

## 2020-04-05 DIAGNOSIS — Z79899 Other long term (current) drug therapy: Secondary | ICD-10-CM | POA: Insufficient documentation

## 2020-04-05 HISTORY — PX: BREAST LUMPECTOMY WITH RADIOACTIVE SEED LOCALIZATION: SHX6424

## 2020-04-05 HISTORY — DX: Family history of other specified conditions: Z84.89

## 2020-04-05 LAB — POCT PREGNANCY, URINE: Preg Test, Ur: NEGATIVE

## 2020-04-05 SURGERY — BREAST LUMPECTOMY WITH RADIOACTIVE SEED LOCALIZATION
Anesthesia: Regional | Site: Breast | Laterality: Right

## 2020-04-05 MED ORDER — CEFAZOLIN SODIUM-DEXTROSE 2-4 GM/100ML-% IV SOLN
INTRAVENOUS | Status: AC
  Filled 2020-04-05: qty 100

## 2020-04-05 MED ORDER — BUPIVACAINE-EPINEPHRINE 0.25% -1:200000 IJ SOLN
INTRAMUSCULAR | Status: DC | PRN
  Administered 2020-04-05: 20 mL

## 2020-04-05 MED ORDER — KETAMINE HCL 100 MG/ML IJ SOLN
INTRAMUSCULAR | Status: AC
  Filled 2020-04-05: qty 1

## 2020-04-05 MED ORDER — LIDOCAINE HCL (CARDIAC) PF 100 MG/5ML IV SOSY
PREFILLED_SYRINGE | INTRAVENOUS | Status: DC | PRN
  Administered 2020-04-05: 20 mg via INTRATRACHEAL

## 2020-04-05 MED ORDER — FENTANYL CITRATE (PF) 100 MCG/2ML IJ SOLN
INTRAMUSCULAR | Status: DC | PRN
  Administered 2020-04-05 (×2): 50 ug via INTRAVENOUS

## 2020-04-05 MED ORDER — MIDAZOLAM HCL 2 MG/2ML IJ SOLN
INTRAMUSCULAR | Status: AC
  Filled 2020-04-05: qty 2

## 2020-04-05 MED ORDER — KETAMINE HCL 100 MG/ML IJ SOLN
INTRAMUSCULAR | Status: DC | PRN
  Administered 2020-04-05 (×2): 20 mg via INTRAVENOUS

## 2020-04-05 MED ORDER — DEXAMETHASONE SODIUM PHOSPHATE 10 MG/ML IJ SOLN
INTRAMUSCULAR | Status: AC
  Filled 2020-04-05: qty 1

## 2020-04-05 MED ORDER — CHLORHEXIDINE GLUCONATE CLOTH 2 % EX PADS: 6.0000 | MEDICATED_PAD | Freq: Once | CUTANEOUS | Status: DC

## 2020-04-05 MED ORDER — FENTANYL CITRATE (PF) 100 MCG/2ML IJ SOLN
25.0000 ug | INTRAMUSCULAR | Status: DC | PRN
Start: 2020-04-05 — End: 2020-04-05

## 2020-04-05 MED ORDER — MIDAZOLAM HCL 2 MG/2ML IJ SOLN
2.0000 mg | Freq: Once | INTRAMUSCULAR | Status: AC
  Administered 2020-04-05: 08:00:00 2 mg via INTRAVENOUS

## 2020-04-05 MED ORDER — FENTANYL CITRATE (PF) 100 MCG/2ML IJ SOLN
INTRAMUSCULAR | Status: AC
  Filled 2020-04-05: qty 2

## 2020-04-05 MED ORDER — FENTANYL CITRATE (PF) 100 MCG/2ML IJ SOLN
100.0000 ug | Freq: Once | INTRAMUSCULAR | Status: AC
  Administered 2020-04-05: 08:00:00 100 ug via INTRAVENOUS

## 2020-04-05 MED ORDER — GABAPENTIN 300 MG PO CAPS
ORAL_CAPSULE | ORAL | Status: AC
  Filled 2020-04-05: qty 1

## 2020-04-05 MED ORDER — PROPOFOL 10 MG/ML IV BOLUS
INTRAVENOUS | Status: DC | PRN
  Administered 2020-04-05: 200 mg via INTRAVENOUS

## 2020-04-05 MED ORDER — ONDANSETRON HCL 4 MG/2ML IJ SOLN
INTRAMUSCULAR | Status: AC
  Filled 2020-04-05: qty 2

## 2020-04-05 MED ORDER — PROPOFOL 10 MG/ML IV BOLUS
INTRAVENOUS | Status: AC
  Filled 2020-04-05: qty 20

## 2020-04-05 MED ORDER — LIDOCAINE 2% (20 MG/ML) 5 ML SYRINGE
INTRAMUSCULAR | Status: AC
  Filled 2020-04-05: qty 5

## 2020-04-05 MED ORDER — LACTATED RINGERS IV SOLN: INTRAVENOUS | Status: DC

## 2020-04-05 MED ORDER — HYDROCODONE-ACETAMINOPHEN 5-325 MG PO TABS: 1.0000 | ORAL_TABLET | Freq: Four times a day (QID) | ORAL | 0 refills | Status: DC | PRN

## 2020-04-05 MED ORDER — ACETAMINOPHEN 500 MG PO TABS
1000.0000 mg | ORAL_TABLET | ORAL | Status: AC
  Administered 2020-04-05: 07:00:00 1000 mg via ORAL

## 2020-04-05 MED ORDER — ONDANSETRON HCL 4 MG/2ML IJ SOLN
INTRAMUSCULAR | Status: DC | PRN
  Administered 2020-04-05: 4 mg via INTRAVENOUS

## 2020-04-05 MED ORDER — ROPIVACAINE HCL 5 MG/ML IJ SOLN
INTRAMUSCULAR | Status: DC | PRN
  Administered 2020-04-05: 30 mL via PERINEURAL

## 2020-04-05 MED ORDER — GABAPENTIN 300 MG PO CAPS
300.0000 mg | ORAL_CAPSULE | ORAL | Status: AC
  Administered 2020-04-05: 07:00:00 300 mg via ORAL

## 2020-04-05 MED ORDER — DEXAMETHASONE SODIUM PHOSPHATE 10 MG/ML IJ SOLN
INTRAMUSCULAR | Status: DC | PRN
  Administered 2020-04-05: 09:00:00 5 mg via INTRAVENOUS

## 2020-04-05 MED ORDER — ACETAMINOPHEN 500 MG PO TABS
ORAL_TABLET | ORAL | Status: AC
  Filled 2020-04-05: qty 2

## 2020-04-05 MED ORDER — GLYCOPYRROLATE 0.2 MG/ML IJ SOLN
INTRAMUSCULAR | Status: DC | PRN
  Administered 2020-04-05: .2 mg via INTRAVENOUS

## 2020-04-05 MED ORDER — DEXAMETHASONE SODIUM PHOSPHATE 10 MG/ML IJ SOLN
INTRAMUSCULAR | Status: DC | PRN
  Administered 2020-04-05: 5 mg

## 2020-04-05 MED ORDER — OXYCODONE HCL 5 MG PO TABS
5.0000 mg | ORAL_TABLET | Freq: Once | ORAL | Status: AC
  Administered 2020-04-05: 12:00:00 5 mg via ORAL

## 2020-04-05 MED ORDER — MIDAZOLAM HCL 2 MG/2ML IJ SOLN
2.0000 mg | Freq: Once | INTRAMUSCULAR | Status: AC
  Administered 2020-04-05 (×2): 2 mg via INTRAVENOUS

## 2020-04-05 MED ORDER — BUPIVACAINE HCL (PF) 0.25 % IJ SOLN
INTRAMUSCULAR | Status: AC
  Filled 2020-04-05: qty 30

## 2020-04-05 MED ORDER — GLYCOPYRROLATE PF 0.2 MG/ML IJ SOSY
PREFILLED_SYRINGE | INTRAMUSCULAR | Status: AC
  Filled 2020-04-05: qty 1

## 2020-04-05 MED ORDER — OXYCODONE HCL 5 MG PO TABS
ORAL_TABLET | ORAL | Status: AC
  Filled 2020-04-05: qty 1

## 2020-04-05 MED ORDER — CEFAZOLIN SODIUM-DEXTROSE 2-4 GM/100ML-% IV SOLN
2.0000 g | INTRAVENOUS | Status: AC
  Administered 2020-04-05: 09:00:00 2 g via INTRAVENOUS

## 2020-04-05 SURGICAL SUPPLY — 58 items
APL PRP STRL LF DISP 70% ISPRP (MISCELLANEOUS) ×1
APL SKNCLS STERI-STRIP NONHPOA (GAUZE/BANDAGES/DRESSINGS) ×1
APPLIER CLIP 9.375 MED OPEN (MISCELLANEOUS) ×3
APR CLP MED 9.3 20 MLT OPN (MISCELLANEOUS) ×1
BENZOIN TINCTURE PRP APPL 2/3 (GAUZE/BANDAGES/DRESSINGS) ×3 IMPLANT
BLADE HEX COATED 2.75 (ELECTRODE) ×3 IMPLANT
BLADE SURG 15 STRL LF DISP TIS (BLADE) ×1 IMPLANT
BLADE SURG 15 STRL SS (BLADE) ×3
CANISTER SUC SOCK COL 7IN (MISCELLANEOUS) IMPLANT
CANISTER SUCT 1200ML W/VALVE (MISCELLANEOUS) IMPLANT
CHLORAPREP W/TINT 26 (MISCELLANEOUS) ×3 IMPLANT
CLIP APPLIE 9.375 MED OPEN (MISCELLANEOUS) ×1 IMPLANT
CLOSURE WOUND 1/2 X4 (GAUZE/BANDAGES/DRESSINGS) ×1
COVER BACK TABLE 60X90IN (DRAPES) ×3 IMPLANT
COVER MAYO STAND STRL (DRAPES) ×3 IMPLANT
COVER PROBE W GEL 5X96 (DRAPES) ×3 IMPLANT
COVER WAND RF STERILE (DRAPES) IMPLANT
DECANTER SPIKE VIAL GLASS SM (MISCELLANEOUS) IMPLANT
DRAPE LAPAROTOMY 100X72 PEDS (DRAPES) ×3 IMPLANT
DRAPE UTILITY XL STRL (DRAPES) ×3 IMPLANT
DRSG TEGADERM 4X4.75 (GAUZE/BANDAGES/DRESSINGS) ×5 IMPLANT
ELECT BLADE 4.0 EZ CLEAN MEGAD (MISCELLANEOUS) ×3
ELECT REM PT RETURN 9FT ADLT (ELECTROSURGICAL) ×3
ELECTRODE BLDE 4.0 EZ CLN MEGD (MISCELLANEOUS) IMPLANT
ELECTRODE REM PT RTRN 9FT ADLT (ELECTROSURGICAL) ×1 IMPLANT
GAUZE SPONGE 4X4 12PLY STRL LF (GAUZE/BANDAGES/DRESSINGS) ×2 IMPLANT
GLOVE BIOGEL PI IND STRL 7.5 (GLOVE) ×1 IMPLANT
GLOVE BIOGEL PI INDICATOR 7.5 (GLOVE) ×2
GLOVE SURG ENC MOIS LTX SZ7 (GLOVE) ×3 IMPLANT
GLOVE SURG SS PI 7.0 STRL IVOR (GLOVE) ×2 IMPLANT
GLOVE SURG UNDER POLY LF SZ7 (GLOVE) ×4 IMPLANT
GOWN STRL REUS W/ TWL LRG LVL3 (GOWN DISPOSABLE) ×2 IMPLANT
GOWN STRL REUS W/TWL LRG LVL3 (GOWN DISPOSABLE) ×6
ILLUMINATOR WAVEGUIDE N/F (MISCELLANEOUS) IMPLANT
KIT MARKER MARGIN INK (KITS) ×3 IMPLANT
LIGHT WAVEGUIDE WIDE FLAT (MISCELLANEOUS) IMPLANT
NDL HYPO 25X1 1.5 SAFETY (NEEDLE) ×1 IMPLANT
NEEDLE HYPO 25X1 1.5 SAFETY (NEEDLE) ×3 IMPLANT
NS IRRIG 1000ML POUR BTL (IV SOLUTION) ×3 IMPLANT
PACK BASIN DAY SURGERY FS (CUSTOM PROCEDURE TRAY) ×3 IMPLANT
PENCIL SMOKE EVACUATOR (MISCELLANEOUS) ×3 IMPLANT
SLEEVE SCD COMPRESS KNEE MED (MISCELLANEOUS) ×3 IMPLANT
SPONGE GAUZE 2X2 8PLY STER LF (GAUZE/BANDAGES/DRESSINGS)
SPONGE GAUZE 2X2 8PLY STRL LF (GAUZE/BANDAGES/DRESSINGS) IMPLANT
SPONGE LAP 18X18 RF (DISPOSABLE) IMPLANT
SPONGE LAP 4X18 RFD (DISPOSABLE) ×3 IMPLANT
STRIP CLOSURE SKIN 1/2X4 (GAUZE/BANDAGES/DRESSINGS) ×2 IMPLANT
SUT MON AB 4-0 PC3 18 (SUTURE) ×3 IMPLANT
SUT SILK 2 0 SH (SUTURE) IMPLANT
SUT VIC AB 3-0 SH 27 (SUTURE) ×3
SUT VIC AB 3-0 SH 27X BRD (SUTURE) ×1 IMPLANT
SYR BULB EAR ULCER 3OZ GRN STR (SYRINGE) IMPLANT
SYR CONTROL 10ML LL (SYRINGE) ×3 IMPLANT
TOWEL GREEN STERILE FF (TOWEL DISPOSABLE) ×3 IMPLANT
TRAY FAXITRON CT DISP (TRAY / TRAY PROCEDURE) ×3 IMPLANT
TUBE CONNECTING 20'X1/4 (TUBING)
TUBE CONNECTING 20X1/4 (TUBING) IMPLANT
YANKAUER SUCT BULB TIP NO VENT (SUCTIONS) IMPLANT

## 2020-04-05 NOTE — Anesthesia Postprocedure Evaluation (Signed)
Anesthesia Post Note  Patient: [name]  Procedure(s) Performed: RIGHT BREAST LUMPECTOMY X 2 WITH RADIOACTIVE SEED LOCALIZATION (Right Breast)     Patient location during evaluation: PACU Anesthesia Type: Regional and General Level of consciousness: awake and alert Pain management: pain level controlled Vital Signs Assessment: post-procedure vital signs reviewed and stable Respiratory status: spontaneous breathing, nonlabored ventilation, respiratory function stable and patient connected to nasal cannula oxygen Cardiovascular status: blood pressure returned to baseline and stable Postop Assessment: no apparent nausea or vomiting Anesthetic complications: no   No complications documented.  Last Vitals:  Vitals:   04/05/20 1115 04/05/20 1230  BP: 109/67 100/76  Pulse: 89 87  Resp: 14 17  Temp:  36.7 C  SpO2: 95% 98%    Last Pain:  Vitals:   04/05/20 1230  TempSrc:   PainSc: 4                  Yazid Pop L Everson Mott

## 2020-04-05 NOTE — Discharge Instructions (Signed)
[facility] °Office Phone Number [phone] ° °BREAST BIOPSY/ PARTIAL MASTECTOMY: POST OP INSTRUCTIONS ° °Always review your discharge instruction sheet given to you by the facility where your surgery was performed. ° °IF YOU HAVE DISABILITY OR FAMILY LEAVE FORMS, YOU MUST BRING THEM TO THE OFFICE FOR PROCESSING.  DO NOT GIVE THEM TO YOUR DOCTOR. ° °1. A prescription for pain medication may be given to you upon discharge.  Take your pain medication as prescribed, if needed.  If narcotic pain medicine is not needed, then you may take acetaminophen (Tylenol) or ibuprofen (Advil) as needed. °2. Take your usually prescribed medications unless otherwise directed °3. If you need a refill on your pain medication, please contact your pharmacy.  They will contact our office to request authorization.  Prescriptions will not be filled after 5pm or on week-ends. °4. You should eat very light the first 24 hours after surgery, such as soup, crackers, pudding, etc.  Resume your normal diet the day after surgery. °5. Most patients will experience some swelling and bruising in the breast.  Ice packs and a good support bra will help.  Swelling and bruising can take several days to resolve.  °6. It is common to experience some constipation if taking pain medication after surgery.  Increasing fluid intake and taking a stool softener will usually help or prevent this problem from occurring.  A mild laxative (Milk of Magnesia or Miralax) should be taken according to package directions if there are no bowel movements after 48 hours. °7. Unless discharge instructions indicate otherwise, you may remove your bandages 24-48 hours after surgery, and you may shower at that time.  You may have steri-strips (small skin tapes) in place directly over the incision.  These strips should be left on the skin for 7-10 days.  If your surgeon used skin glue on the incision, you may shower in 24 hours.  The glue will flake off over the  next 2-3 weeks.  Any sutures or staples will be removed at the office during your follow-up visit. °8. ACTIVITIES:  You may resume regular daily activities (gradually increasing) beginning the next day.  Wearing a good support bra or sports bra minimizes pain and swelling.  You may have sexual intercourse when it is comfortable. °a. You may drive when you no longer are taking prescription pain medication, you can comfortably wear a seatbelt, and you can safely maneuver your car and apply brakes. °b. RETURN TO WORK:  ______________________________________________________________________________________ °9. You should see your doctor in the office for a follow-up appointment approximately two weeks after your surgery.  Your doctor’s nurse will typically make your follow-up appointment when she calls you with your pathology report.  Expect your pathology report 2-3 business days after your surgery.  You may call to check if you do not hear from us after three days. °10. OTHER INSTRUCTIONS: _______________________________________________________________________________________________ _____________________________________________________________________________________________________________________________________ °_____________________________________________________________________________________________________________________________________ °_____________________________________________________________________________________________________________________________________ ° °WHEN TO CALL YOUR DOCTOR: °1. Fever over 101.0 °2. Nausea and/or vomiting. °3. Extreme swelling or bruising. °4. Continued bleeding from incision. °5. Increased pain, redness, or drainage from the incision. ° °The clinic staff is available to answer your questions during regular business hours.  Please don’t hesitate to call and ask to speak to one of the nurses for clinical concerns.  If you have a medical emergency, go to the nearest  emergency room or call 911.  A surgeon from [facility] is always on call at the hospital. ° °For further questions, please visit [email or web address]  ° ° °  No Tylenol until 1pm if needed   Post Anesthesia Home Care Instructions  Activity: Get plenty of rest for the remainder of the day. A responsible individual must stay with you for 24 hours following the procedure.  For the next 24 hours, DO NOT: -Drive a car -Paediatric nurse -Drink alcoholic beverages -Take any medication unless instructed by your physician -Make any legal decisions or sign important papers.  Meals: Start with liquid foods such as gelatin or soup. Progress to regular foods as tolerated. Avoid greasy, spicy, heavy foods. If nausea and/or vomiting occur, drink only clear liquids until the nausea and/or vomiting subsides. Call your physician if vomiting continues.  Special Instructions/Symptoms: Your throat may feel dry or sore from the anesthesia or the breathing tube placed in your throat during surgery. If this causes discomfort, gargle with warm salt water. The discomfort should disappear within 24 hours.  If you had a scopolamine patch placed behind your ear for the management of post- operative nausea and/or vomiting:  1. The medication in the patch is effective for 72 hours, after which it should be removed.  Wrap patch in a tissue and discard in the trash. Wash hands thoroughly with soap and water. 2. You may remove the patch earlier than 72 hours if you experience unpleasant side effects which may include dry mouth, dizziness or visual disturbances. 3. Avoid touching the patch. Wash your hands with soap and water after contact with the patch.

## 2020-04-05 NOTE — Anesthesia Procedure Notes (Signed)
Procedure Name: LMA Insertion Date/Time: 04/05/2020 9:05 AM Performed by: Collier Bullock, CRNA Pre-anesthesia Checklist: Patient identified, Emergency Drugs available, Suction available and Patient being monitored Patient Re-evaluated:Patient Re-evaluated prior to induction Oxygen Delivery Method: Circle system utilized Preoxygenation: Pre-oxygenation with 100% oxygen Induction Type: IV induction LMA: LMA flexible inserted LMA Size: 4.0 Number of attempts: 1 Placement Confirmation: positive ETCO2 and breath sounds checked- equal and bilateral Tube secured with: Tape Dental Injury: Teeth and Oropharynx as per pre-operative assessment

## 2020-04-05 NOTE — Anesthesia Procedure Notes (Signed)
Anesthesia Regional Block: Pectoralis block   Pre-Anesthetic Checklist: ,, timeout performed, Correct Patient, Correct Site, Correct Laterality, Correct Procedure, Correct Position, site marked, Risks and benefits discussed,  Surgical consent,  Pre-op evaluation,  At surgeon's request and post-op pain management  Laterality: Right  Prep: Maximum Sterile Barrier Precautions used, chloraprep       Needles:  Injection technique: Single-shot  Needle Type: Echogenic Stimulator Needle     Needle Length: 9cm  Needle Gauge: 22     Additional Needles:   Procedures:,,,, ultrasound used (permanent image in chart),,,,  Narrative:  Start time: 04/05/2020 8:05 AM End time: 04/05/2020 8:15 AM Injection made incrementally with aspirations every 5 mL.  Performed by: Personally  Anesthesiologist: Freddrick March, MD  Additional Notes: Monitors applied. No increased pain on injection. No increased resistance to injection. Injection made in 5cc increments. Good needle visualization. Patient tolerated procedure well.
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
## 2020-04-05 NOTE — Transfer of Care (Signed)
Immediate Anesthesia Transfer of Care Note  Patient: Charron Coultas  Procedure(s) Performed: RIGHT BREAST LUMPECTOMY X 2 WITH RADIOACTIVE SEED LOCALIZATION (Right Breast)  Patient Location: PACU  Anesthesia Type:General and Regional  Level of Consciousness: awake, oriented and patient cooperative  Airway & Oxygen Therapy: Patient Spontanous Breathing and Patient connected to face mask oxygen  Post-op Assessment: Report given to RN, Post -op Vital signs reviewed and stable, Patient moving all extremities X 4 and Patient able to stick tongue midline  Post vital signs: Reviewed and stable  Last Vitals:  Vitals Value Taken Time  BP    Temp    Pulse 108 04/05/20 1023  Resp 24 04/05/20 1023  SpO2 100 % 04/05/20 1023  Vitals shown include unvalidated device data.  Last Pain:  Vitals:   04/05/20 0709  TempSrc: Oral  PainSc: 0-No pain      Patients Stated Pain Goal: 3 (01/75/10 2585)  Complications: No complications documented.

## 2020-04-05 NOTE — Progress Notes (Signed)
Assisted Dr. Lanetta Inch with right, ultrasound guided, pectoralis block. Side rails up, monitors on throughout procedure. See vital signs in flow sheet. Tolerated Procedure well.
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
## 2020-04-06 ENCOUNTER — Encounter (HOSPITAL_BASED_OUTPATIENT_CLINIC_OR_DEPARTMENT_OTHER): Payer: Self-pay | Admitting: Surgery

## 2020-04-06 NOTE — Addendum Note (Signed)
Addendum  created 04/06/20 1325 by Maryella Shivers, CRNA   Charge Capture section accepted

## 2020-04-07 LAB — SURGICAL PATHOLOGY

## 2020-12-29 IMAGING — MG MM PLC BREAST LOC DEV EA ADD LEASION INC MAMMO GUIDE*R*
8 series · 8 of 8 positions shown · non-contrast
Comparison: Previous exam(s).

CLINICAL DATA: 34-year-old with a biopsy-proven biphasic
fibroepithelial lesion (possible phyllodes tumor) in the LOWER OUTER
QUADRANT of the RIGHT breast at the 8:30 o'clock position
approximately 12 cm from nipple (coil clip), and a biopsy proven
fibroadenoma involving the UPPER INNER QUADRANT of the RIGHT breast
at the 2 o'clock position approximately 6 cm from nipple (refer
clip. Radioactive seed localization is performed in anticipation of
2 excisional biopsies which are scheduled for tomorrow.

EXAM:
ULTRASOUND GUIDED RADIOACTIVE SEED LOCALIZATION OF THE RIGHT BREAST
MAMMOGRAPHIC GUIDED RADIOACTIVE SEED LOCALIZATION OF THE RIGHT
BREAST

[R CC (1 of 4)]
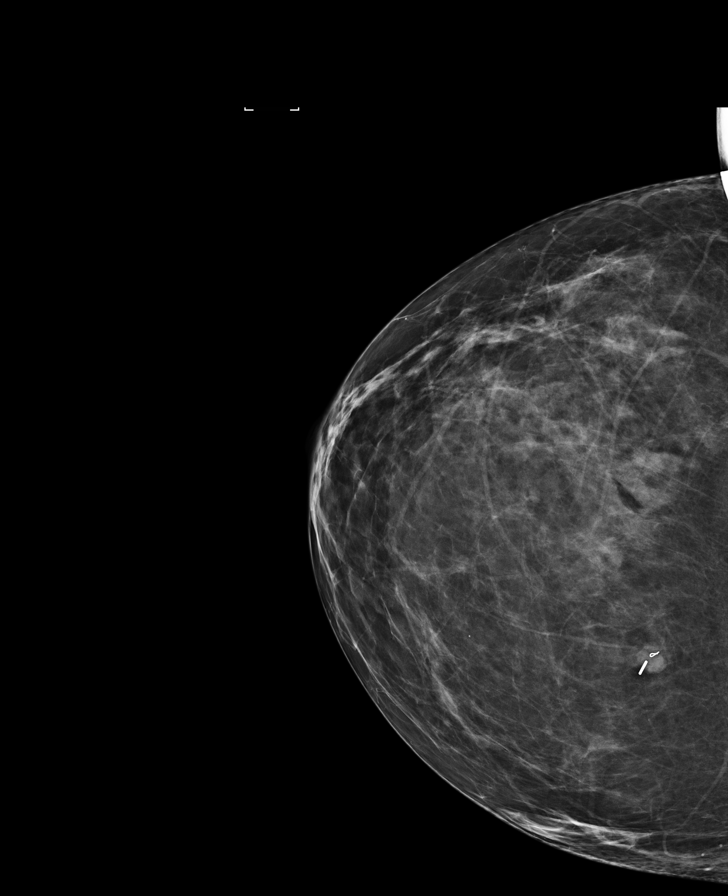

[R ML (1 of 4)]
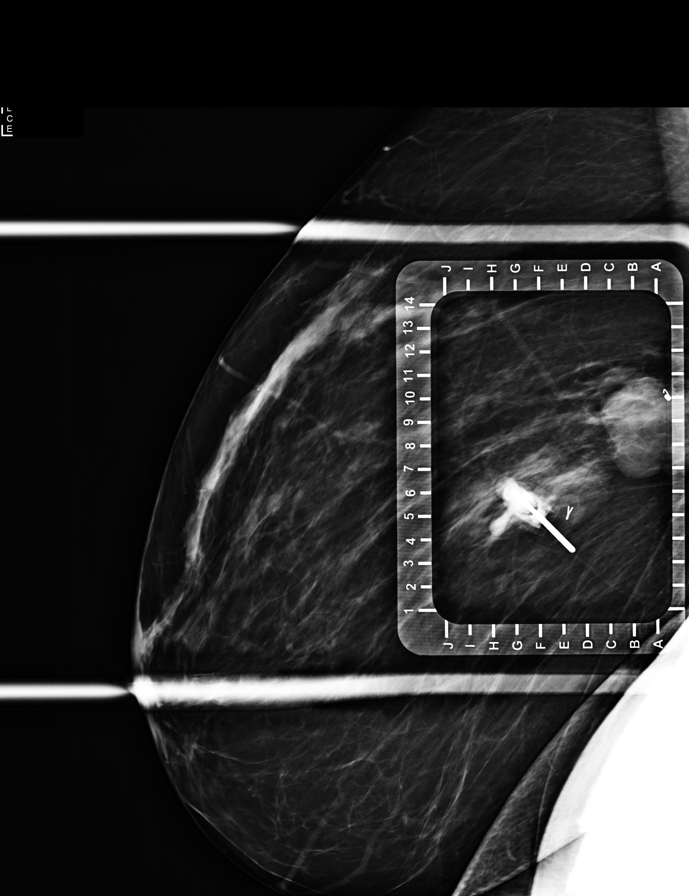

[R ML (2 of 4)]
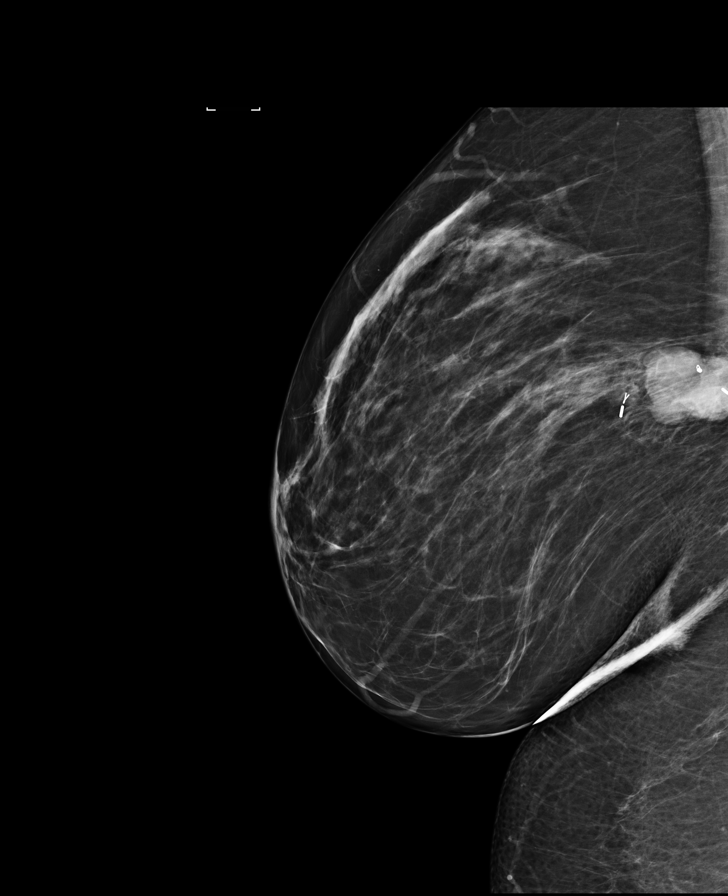

[R CC (2 of 4)]
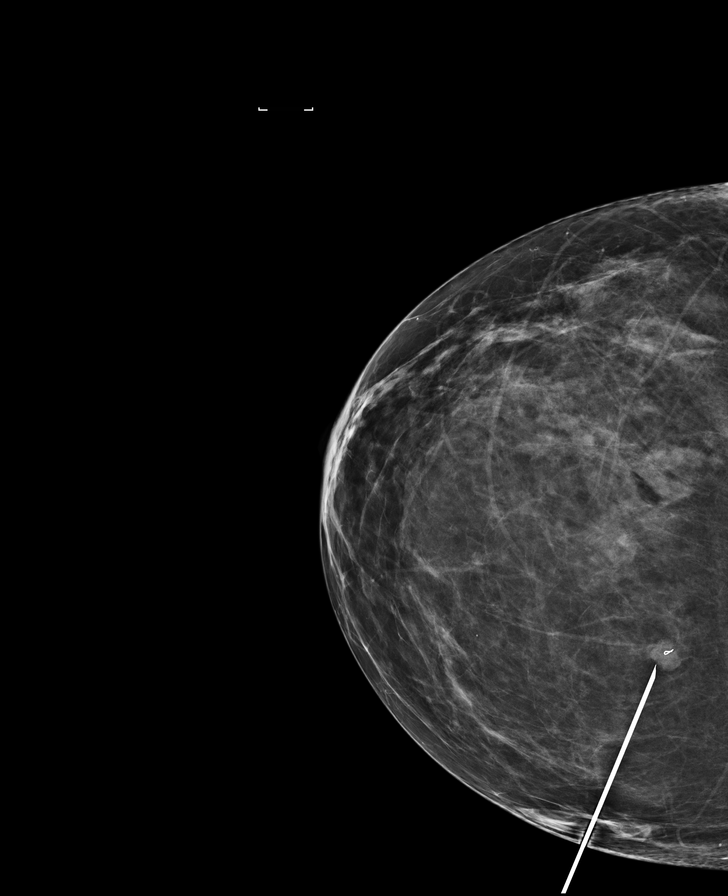

[R CC (3 of 4)]
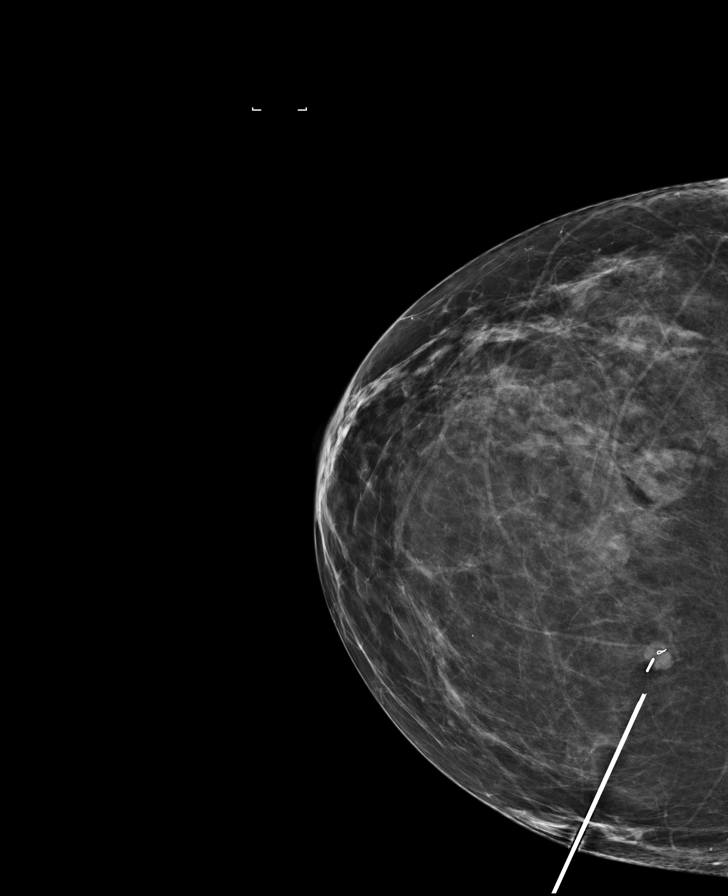

[R ML (3 of 4)]
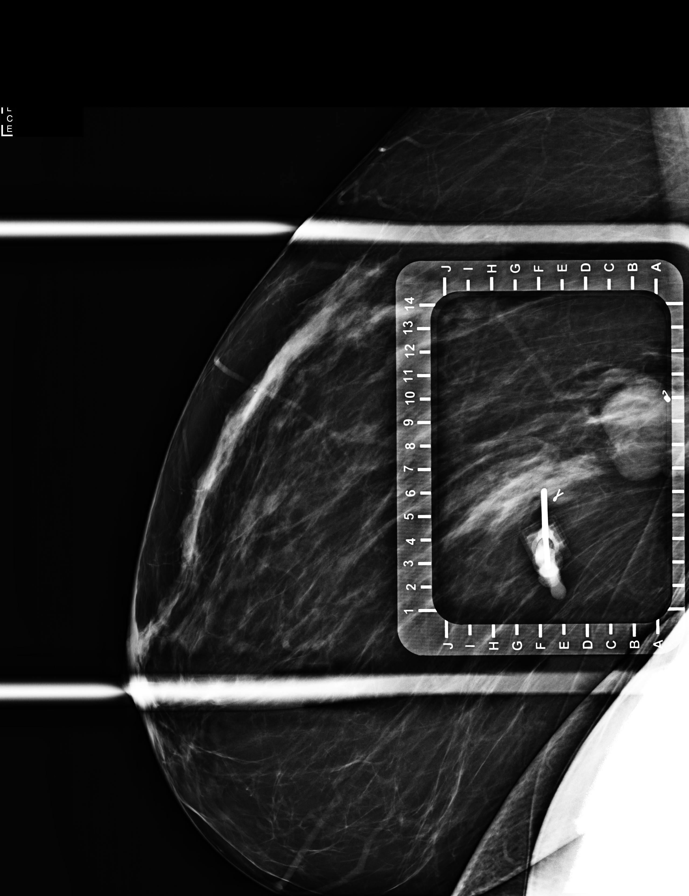

[R ML (4 of 4)]
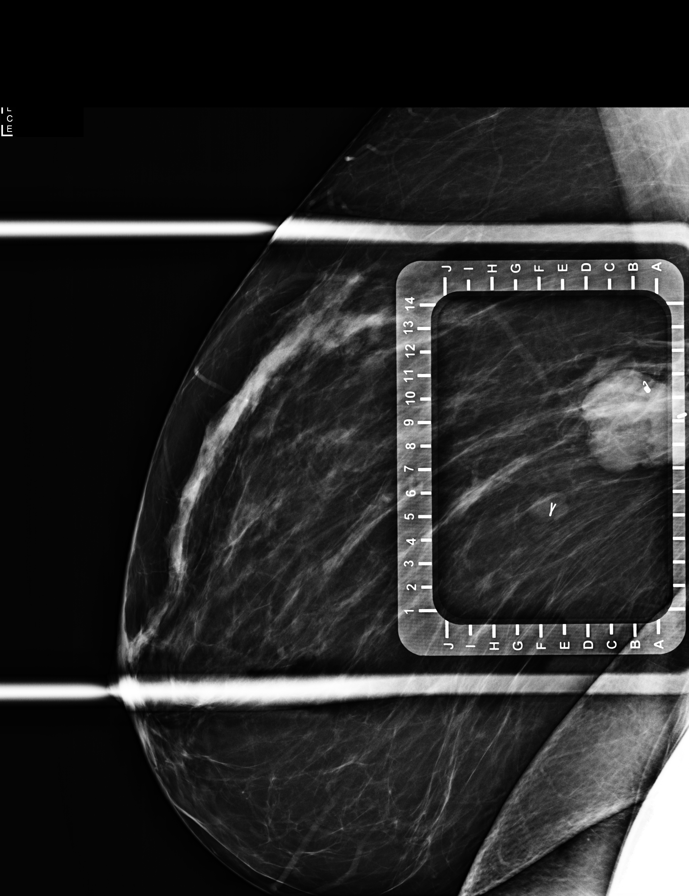

[R CC (4 of 4)]
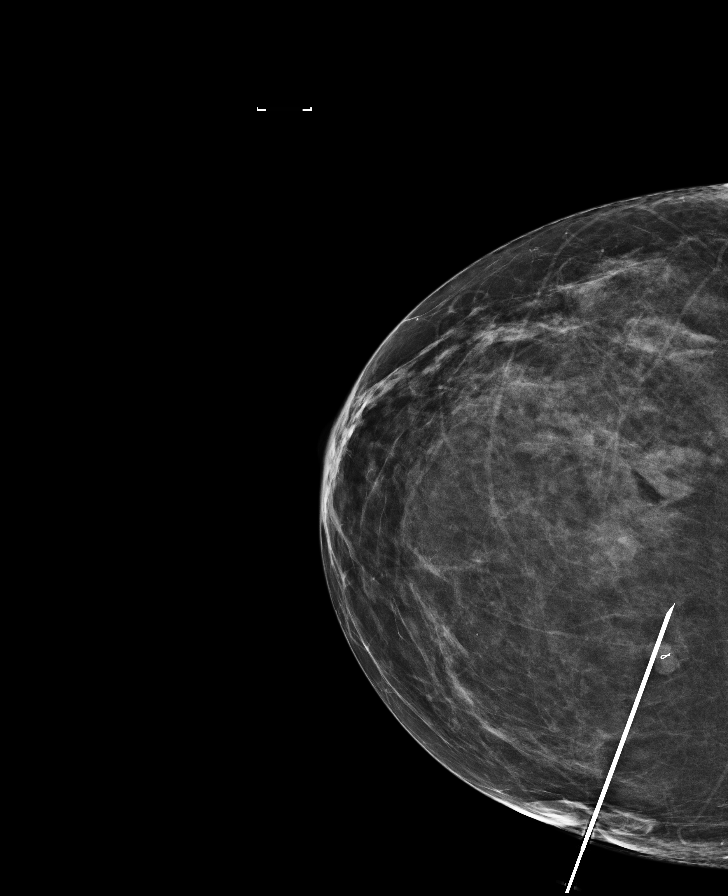

[8 of 8 positions shown; findings below may reference images not displayed]

FINDINGS: Patient presents for radioactive seed localization prior to
excisional biopsy of 2 sites of the RIGHT breast. I met with the
patient and we discussed the procedure of seed localization
including benefits and alternatives. We discussed the high
likelihood of a successful procedure. We discussed the risks of the
procedure including infection, bleeding, tissue injury and further
surgery. We discussed the low dose of radioactivity involved in the
procedure. Informed, written consent was given.

The usual time-out protocol was performed immediately prior to the
procedure.

Initially, using ultrasound guidance, sterile technique with
chlorhexidine as skin antisepsis, 1% lidocaine as local anesthetic,
an D-[id number] radioactive seed was used to localize the biphasic
epithelial lesion associated with the coil shaped tissue marker clip
in the LOWER OUTER QUADRANT of the RIGHT breast using a lateral
approach.

Next, using mammographic guidance, sterile technique with
chlorhexidine as skin antisepsis, 1% lidocaine as local anesthetic,
an D-28Z radioactive seed was used to localize the fibroadenoma
associated with the ribbon shaped tissue marker clip in the UPPER
INNER QUADRANT of the RIGHT breast using a medial approach.

The follow-up mammogram images confirm that both seeds are
appropriately positioned. The radioactive seed is with in the
biphasic fibroepithelial lesion at its posterior margin. The
radioactive seed is immediately adjacent to the radioactive seed
within the fibroadenoma at its inferomedial margin. The images are
marked for Dr. Katsuhito.

Follow-up survey of the patient confirms the presence of both
radioactive seeds.

Biphasic fibroepithelial lesion (coil clip):

Order number of D-28Z seed: [id number]

Total activity: 0.258 mCi

Reference Date: 03/28/2020

Fibroadenoma (ribbon clip):

Order number of D-28Z seed: [id number]

Total activity: 0.243 mCi

Reference Date: 03/01/2020

The patient tolerated the procedure well and was released from the
[REDACTED]. She was given instructions regarding seed removal.
IMPRESSION: Radioactive seed localization of the RIGHT breast x 2. No apparent
complications.

## 2020-12-30 IMAGING — MG MM BREAST SURGICAL SPECIMEN
1 series · 2 of 2 positions shown · non-contrast
Comparison: Previous exam(s).

CLINICAL DATA: Evaluate for specimen

EXAM:
SPECIMEN RADIOGRAPH OF THE RIGHT BREAST

[Series 1: R · right · 0.07mm/px · 2 of 2 slices shown]
[im 1/2]
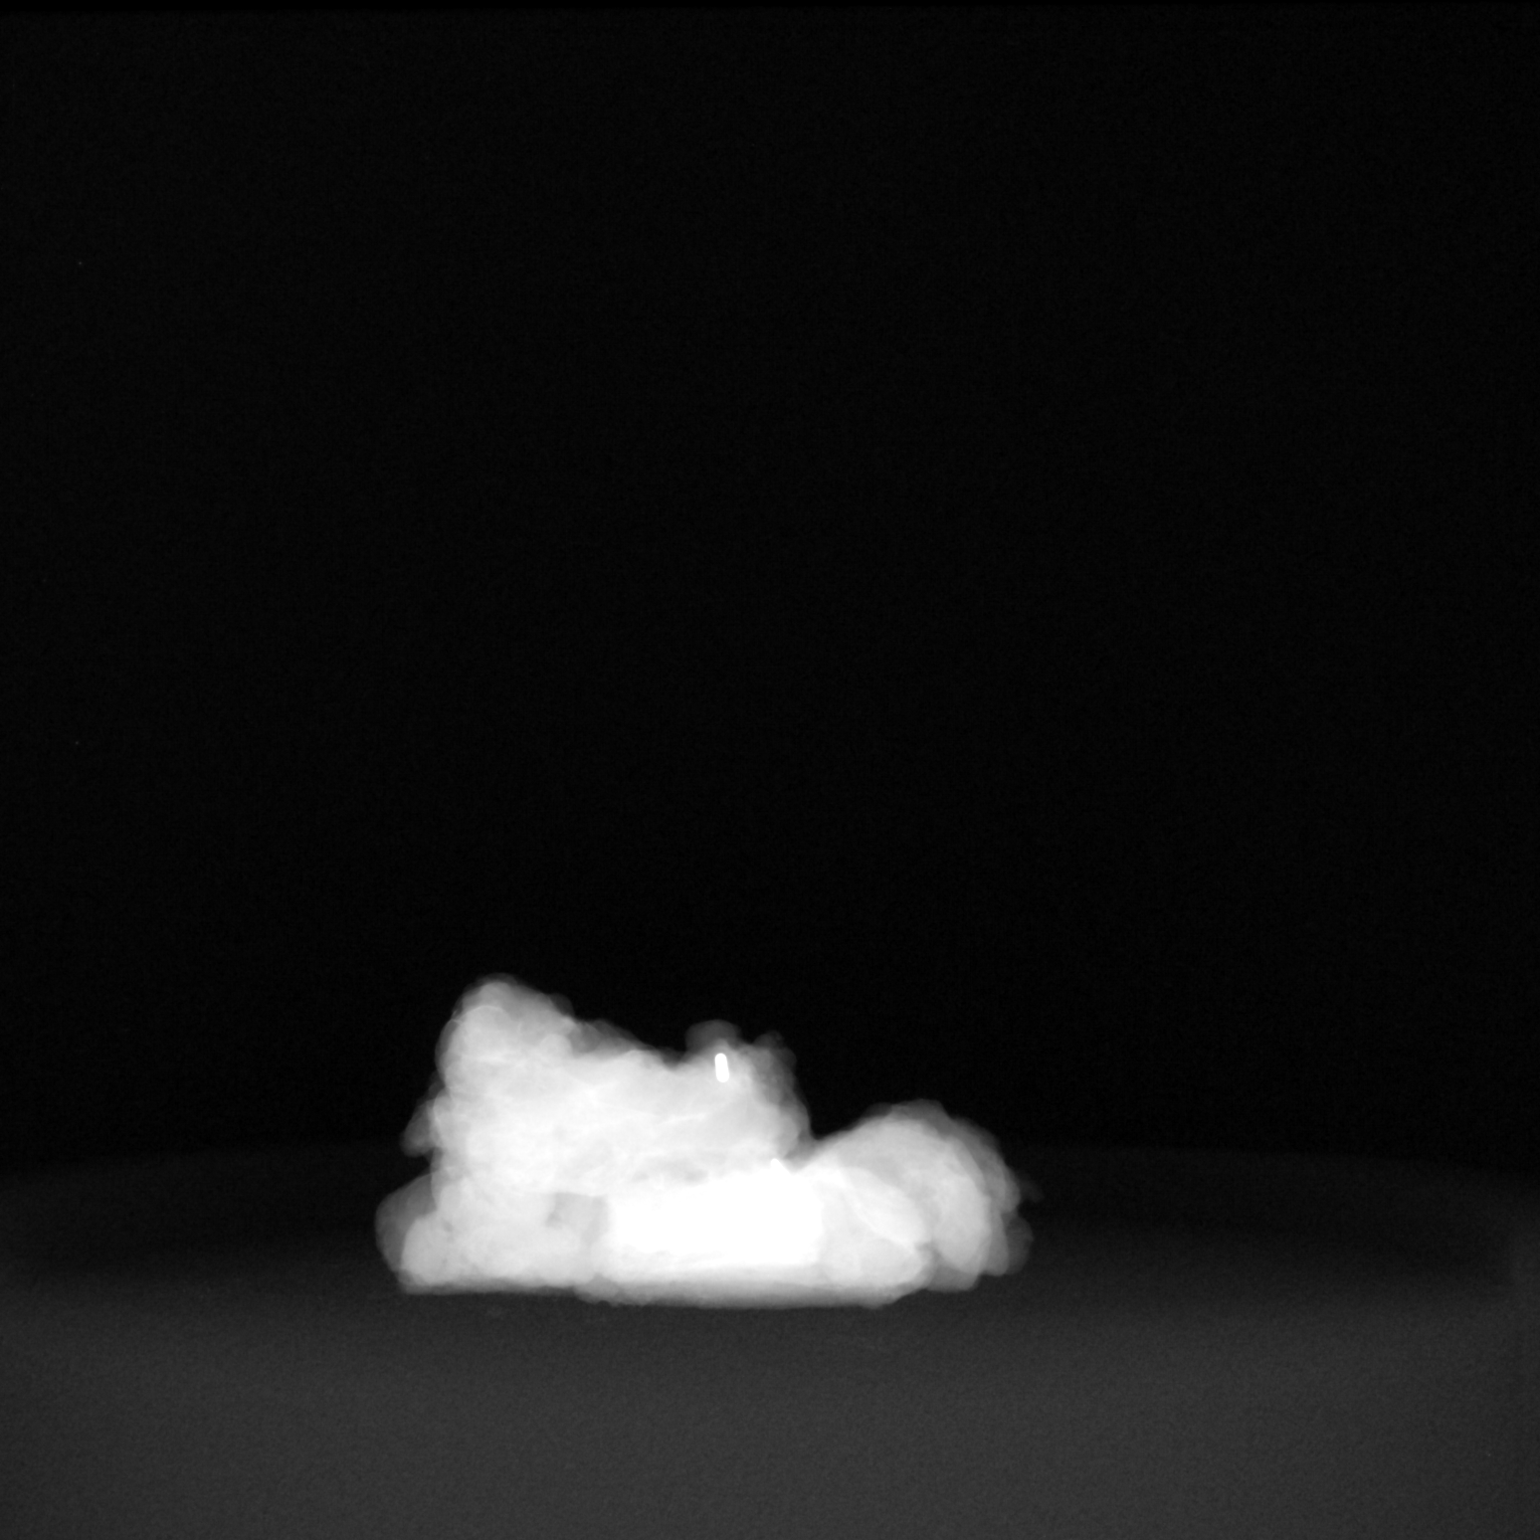
[im 2/2]
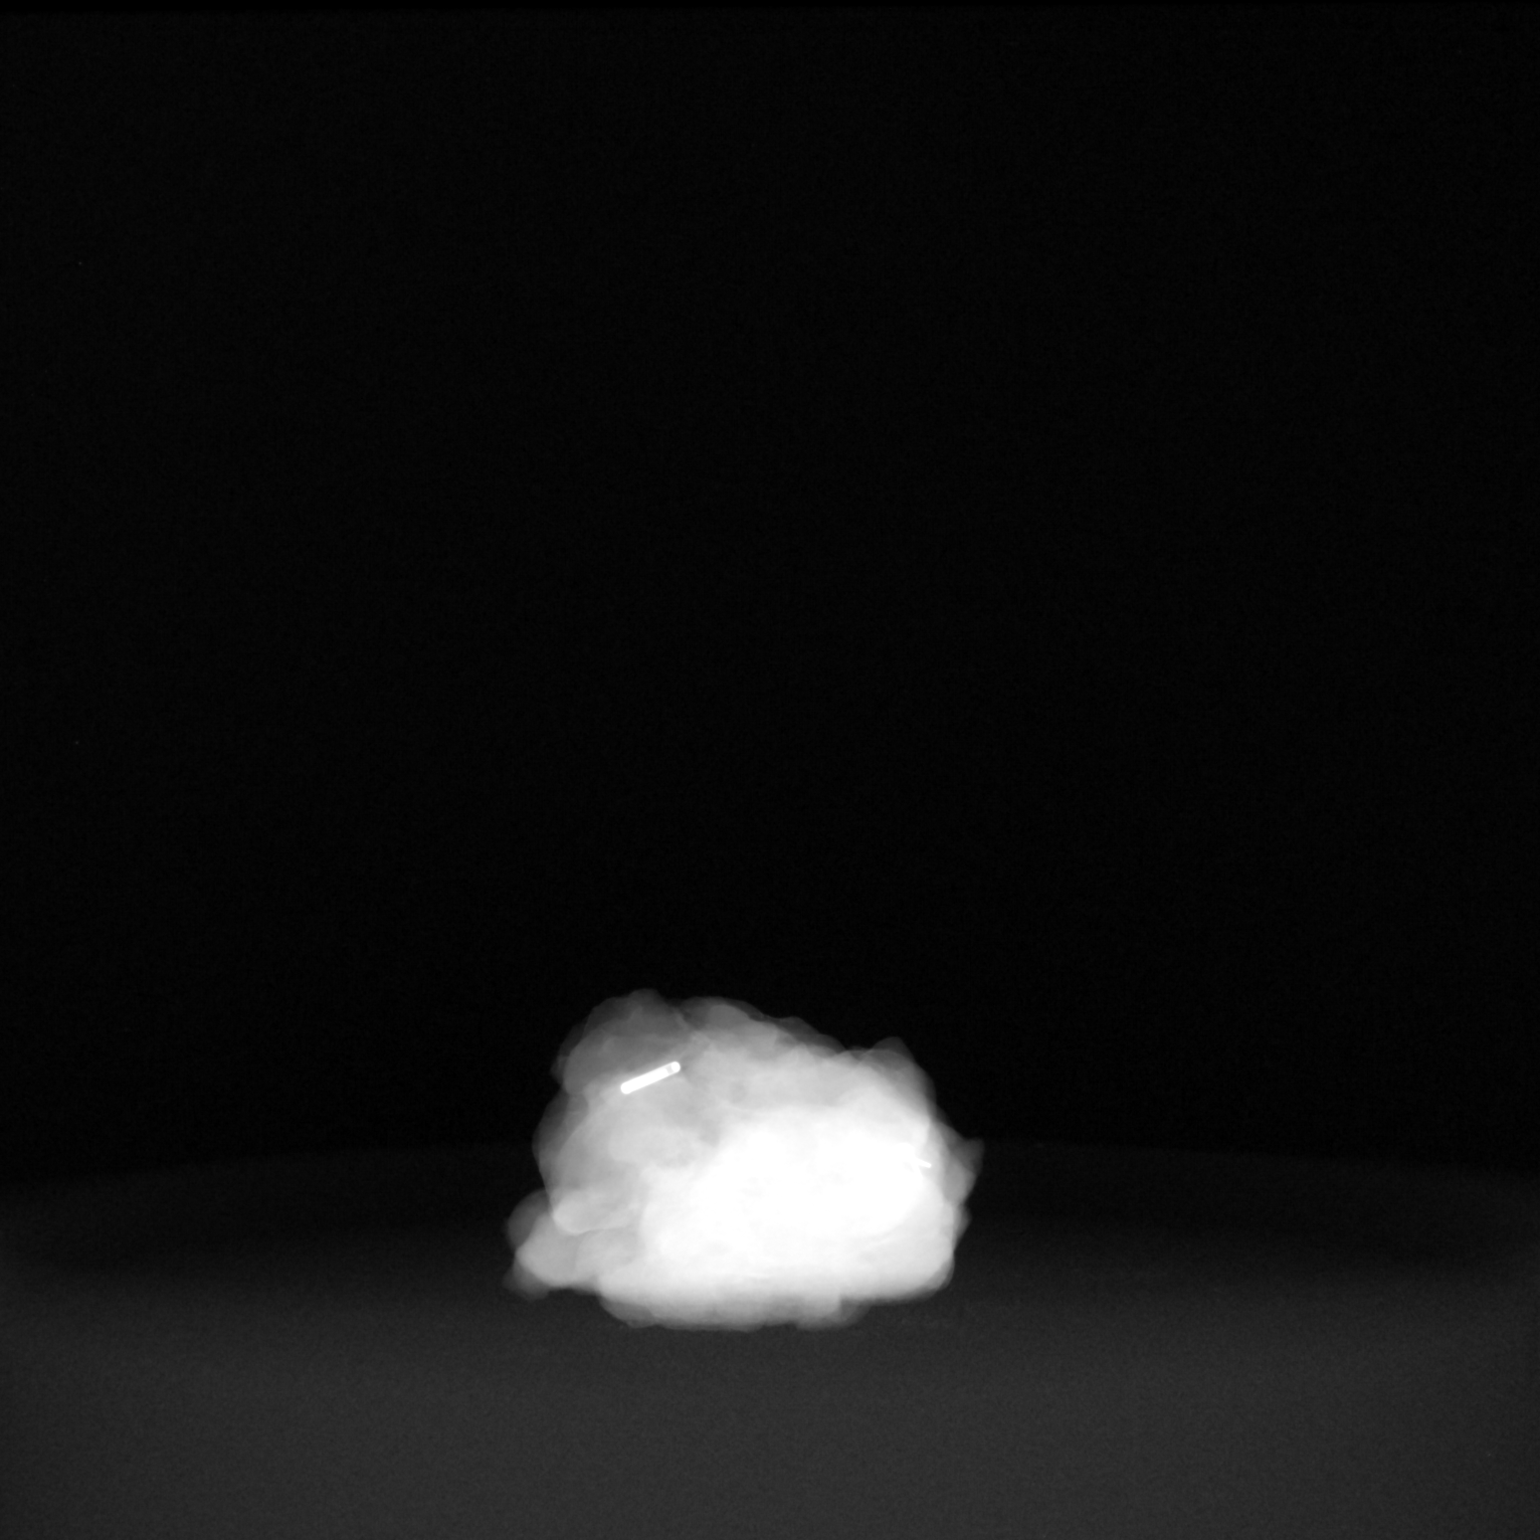

[2 of 2 positions shown; findings below may reference images not displayed]

FINDINGS: Status post excision of the right breast. The radioactive seed and
biopsy marker clip are present, completely intact, and were marked
for pathology.
IMPRESSION: Specimen radiograph of the right breast.

## 2021-03-22 ENCOUNTER — Other Ambulatory Visit: Payer: Self-pay

## 2021-03-22 ENCOUNTER — Encounter (HOSPITAL_COMMUNITY): Payer: Self-pay

## 2021-03-22 ENCOUNTER — Ambulatory Visit (HOSPITAL_COMMUNITY)
Admission: EM | Admit: 2021-03-22 | Discharge: 2021-03-22 | Disposition: A | Discharge status: Home / Self Care | Payer: Medicaid Other | Attending: Emergency Medicine | Admitting: Emergency Medicine

## 2021-03-22 DIAGNOSIS — N390 Urinary tract infection, site not specified: Secondary | ICD-10-CM

## 2021-03-22 DIAGNOSIS — H66001 Acute suppurative otitis media without spontaneous rupture of ear drum, right ear: Secondary | ICD-10-CM

## 2021-03-22 LAB — POCT URINALYSIS DIPSTICK, ED / UC
Bilirubin Urine: NEGATIVE
Glucose, UA: NEGATIVE mg/dL
Hgb urine dipstick: NEGATIVE
Ketones, ur: NEGATIVE mg/dL
Nitrite: NEGATIVE
Protein, ur: NEGATIVE mg/dL
Specific Gravity, Urine: 1.02 (ref 1.005–1.030)
Urobilinogen, UA: 0.2 mg/dL (ref 0.0–1.0)
pH: 7 (ref 5.0–8.0)

## 2021-03-22 LAB — POC URINE PREG, ED: Preg Test, Ur: NEGATIVE

## 2021-03-22 MED ORDER — PHENAZOPYRIDINE HCL 200 MG PO TABS: 200.0000 mg | ORAL_TABLET | Freq: Three times a day (TID) | ORAL | 0 refills | Status: DC | PRN

## 2021-03-22 MED ORDER — FLUTICASONE PROPIONATE 50 MCG/ACT NA SUSP: 2.0000 | Freq: Every day | NASAL | 0 refills | Status: DC

## 2021-03-22 MED ORDER — PROMETHAZINE-DM 6.25-15 MG/5ML PO SYRP: 5.0000 mL | ORAL_SOLUTION | Freq: Four times a day (QID) | ORAL | 0 refills | Status: DC | PRN

## 2021-03-22 MED ORDER — CEFDINIR 300 MG PO CAPS: 300.0000 mg | ORAL_CAPSULE | Freq: Two times a day (BID) | ORAL | 0 refills | Status: DC

## 2021-03-22 NOTE — Discharge Instructions (Signed)
Finish the [place] even if you feel better, 600 mg of ibuprofen combined with 1000 mg Tylenol together 3-4 times a day as needed, saline nasal irrigation, discontinue sweet oil, Flonase, Mucinex D and Promethazine DM for the cough.  Pyridium will help your urinary symptoms.  I have sent a urine culture to make sure that we have you on the right antibiotic.  Push fluids.  Below is a list of primary care practices who are taking new patients for you to follow-up with.  [facility] -multiple locations.  See website at [email or web address]  [facility] internal medicine clinic Ground Floor - [facility], [place], [place], [place] [address] [phone]  [facility] at [facility] [address] [place], [place] [address] [phone]  Shontell Prosser and [place]ron Barn[place], [place] [address] [phone]  [facility]/Internal Medicine [phone] [place] [address]  [facility]: [place]  [phone]  [facility] Family Medicine: [address] [place]  [phone]  [place] primary care : [address] [phone]  [facility] Primary Care: [address] [place] [phone] [phone]  [facility]: [place] [phone]  Dr. Blanchie Serve [place] [address]  [phone]  Go to www.goodrx.com  or www.costplusdrugs.com to look up your medications. This will give you a list of where you can find your prescriptions at the most affordable prices. Or ask the pharmacist what the cash price is, or if they have any other discount programs available to help make your  medication more affordable. This can be less expensive than what you would pay with insurance.

## 2021-03-22 NOTE — ED Triage Notes (Signed)
Pt presents with productive cough with colored mucus, congestion, bilateral ear fullness, and burning with urination X 1 week .

## 2021-03-22 NOTE — ED Provider Notes (Signed)
HPI  SUBJECTIVE:  [name] is a 35 y.o. female who presents with 2 issues:  First, she reports right ear pain, decreased hearing starting 5 to 6 days ago.  She reports vertigo/feeling off balance.  She states that she has had a fever for the past week and a half, yesterday was 102.3.  She states that she had upper respiratory symptoms 2 weeks ago and has a persistent cough that is getting worse.  She states that she is unable to sleep at night secondary to the cough.  She reports nasal congestion, sinus pressure, dark green rhinorrhea, cough productive of the same material as her nasal congestion, some shortness of breath.  No facial swelling, upper dental pain, wheezing.  She has tried sweet oil, Tylenol and Mucinex.  And warm compresses without improvement in her symptoms.  Ear pain is worse with lying on it.  No antibiotics in the past 3 months.  No antipyretic in the past 6 hours.  Second, she reports UTI symptoms starting 2 days ago with dysuria, frequency, cloudy urine, pelvic cramping that she is attributing to menses.  No odorous urine, hematuria, vaginal odor, abnormal vaginal bleeding, vaginal itching, rash, discharge.  No other abdominal pain.  She is in a long-term monogamous relationship with a female who is asymptomatic.  STDs are not a concern today.  She has a past medical history of seizures, UTIs, vaginal yeast infections, and is a smoker.  No history of pyelonephritis, nephrolithiasis, STDs, BV, pulmonary disease.  LMP: This week.  Denies possibility of being pregnant.  PMD: None.  Past Medical History:  Diagnosis Date   Depression    Family history of adverse reaction to anesthesia    grandmother with history of seizure during surgery reported by patient   Fracture, humerus closed    Left   Seizures ([place])    last seizure 12/2019    Past Surgical History:  Procedure Laterality Date   BREAST LUMPECTOMY WITH RADIOACTIVE SEED LOCALIZATION Right 04/05/2020    Procedure: RIGHT BREAST LUMPECTOMY X 2 WITH RADIOACTIVE SEED LOCALIZATION;  Surgeon: Donnie Mesa, MD;  Location: [facility];  Service: General;  Laterality: Right;   HUMERUS IM NAIL Left 02/18/2013   Procedure: LEFT HUMERAL IM ROD (ANTEGRADE);  Surgeon: Alta Corning, MD;  Location: [facility];  Service: Orthopedics;  Laterality: Left;   SHOULDER SURGERY     left shoulder, rod   WISDOM TOOTH EXTRACTION      Family History  Problem Relation Age of Onset   Cervical cancer Mother    COPD Mother     Social History   Tobacco Use   Smoking status: Every Day    Packs/day: 0.50    Types: Cigarettes   Smokeless tobacco: Never   Tobacco comments:    15 cigarettes per day  Vaping Use   Vaping Use: Never used  Substance Use Topics   Alcohol use: Yes    Comment: social   Drug use: No    No current facility-administered medications for this encounter.  Current Outpatient Medications:    HYDROcodone-acetaminophen (NORCO/VICODIN) 5-325 MG tablet, Take 1 tablet by mouth every 6 (six) hours as needed for moderate pain., Disp: 15 tablet, Rfl: 0   OXcarbazepine (TRILEPTAL) 150 MG tablet, Take 150 mg by mouth 2 (two) times daily., Disp: , Rfl:    sertraline (ZOLOFT) 100 MG tablet, Take 100 mg by mouth in the morning and at bedtime., Disp: , Rfl:  traZODone (DESYREL) 100 MG tablet, Take 100 mg by mouth at bedtime., Disp: , Rfl:   Allergies  Allergen Reactions   Depakote [Divalproex Sodium]    Divalproex Sodium Hives     ROS  As noted in HPI.   Physical Exam  BP (!) 136/96 (BP Location: Left Arm)   Pulse 78   Temp 98.5 F (36.9 C) (Oral)   Resp 17   LMP 03/17/2021   SpO2 99%   Constitutional: Well developed, well nourished, no acute distress Eyes:  EOMI, conjunctiva normal bilaterally HENT: Normocephalic, atraumatic,mucus membranes moist.  Right TM dull, erythematous, bulging.  Decreased hearing in right ear compared to left.  Left TM normal.   Positive nasal congestion.  No maxillary, frontal sinus tenderness. Respiratory: Normal inspiratory effort, lungs clear bilaterally Cardiovascular: Normal rate, regular rhythm, no murmurs rubs or gallop GI: nondistended soft, no suprapubic, flank tenderness. Back: No CVAT skin: No rash, skin intact Musculoskeletal: no deformities Neurologic: Alert & oriented x 3, no focal neuro deficits Psychiatric: Speech and behavior appropriate   ED Course   Medications - No data to display  Orders Placed This Encounter  Procedures   Urine Culture    Standing Status:   Standing    Number of Occurrences:   1    Order Specific Question:   Indication    Answer:   Dysuria   POC Urinalysis dipstick    Standing Status:   Standing    Number of Occurrences:   1   POC urine pregnancy    Standing Status:   Standing    Number of Occurrences:   1    Results for orders placed or performed during the hospital encounter of 03/22/21 (from the past 24 hour(s))  POC Urinalysis dipstick     Status: Abnormal   Collection Time: 03/22/21  9:35 AM  Result Value Ref Range   Glucose, UA NEGATIVE NEGATIVE mg/dL   Bilirubin Urine NEGATIVE NEGATIVE   Ketones, ur NEGATIVE NEGATIVE mg/dL   Specific Gravity, Urine 1.020 1.005 - 1.030   Hgb urine dipstick NEGATIVE NEGATIVE   pH 7.0 5.0 - 8.0   Protein, ur NEGATIVE NEGATIVE mg/dL   Urobilinogen, UA 0.2 0.0 - 1.0 mg/dL   Nitrite NEGATIVE NEGATIVE   Leukocytes,Ua SMALL (A) NEGATIVE  POC urine pregnancy     Status: None   Collection Time: 03/22/21  9:39 AM  Result Value Ref Range   Preg Test, Ur NEGATIVE NEGATIVE   No results found.  ED Clinical Impression  1. Non-recurrent acute suppurative otitis media of right ear without spontaneous rupture of tympanic membrane   2. Urinary tract infection without hematuria, site unspecified      ED Assessment/Plan  1.  URI/cough with resulting right-sided otitis media.  Will send home with Omnicef, ibuprofen/Tylenol,  saline nasal irrigation, discontinue sweet oil, Flonase, Mucinex D and Promethazine DM.  2.  UTI: Carole Civil will cover UTI.  Urine culture sent.  Also home with Pyridium, push fluids.  Strict ER return precautions given.  Providing primary care list and ordering assistance in finding a PMD.  Discussed labs,  MDM, treatment plan, and plan for follow-up with patient. Discussed sn/sx that should prompt return to the ED. patient agrees with plan.   No orders of the defined types were placed in this encounter.     *This clinic note was created using Dragon dictation software. Therefore, there may be occasional mistakes despite careful proofreading.  ?    Melynda Ripple, MD 03/22/21  1740  

## 2021-03-23 LAB — URINE CULTURE: Culture: <10000 — AB

## 2021-03-30 ENCOUNTER — Encounter (HOSPITAL_COMMUNITY): Payer: Self-pay

## 2021-03-30 ENCOUNTER — Other Ambulatory Visit: Payer: Self-pay

## 2021-03-30 ENCOUNTER — Ambulatory Visit (HOSPITAL_COMMUNITY)
Admission: EM | Admit: 2021-03-30 | Discharge: 2021-03-30 | Disposition: A | Discharge status: Home / Self Care | Payer: Self-pay | Attending: Family Medicine | Admitting: Family Medicine

## 2021-03-30 DIAGNOSIS — H6691 Otitis media, unspecified, right ear: Secondary | ICD-10-CM

## 2021-03-30 DIAGNOSIS — J0100 Acute maxillary sinusitis, unspecified: Secondary | ICD-10-CM

## 2021-03-30 MED ORDER — LEVOFLOXACIN 500 MG PO TABS: 500.0000 mg | ORAL_TABLET | Freq: Every day | ORAL | 0 refills | Status: AC

## 2021-03-30 NOTE — ED Provider Notes (Addendum)
[name]    CSN: [id number] Arrival date & time: 03/30/21  1534      History   Chief Complaint Chief Complaint  Patient presents with   Otalgia   Nasal Congestion         HPI [name] is a 35 y.o. female.    Otalgia Here for about a 1 month h/o congestion, pressure in her sinus areas, and trouble hearing out of the right ear. The right ear also hurts. Had some fever about 2 weeks ago, none now.   Seen here 12/7, and rx'd omnicef for above symptoms plus UTI symptoms. Also rx'd flonase. Reports no improvement.  Has had some loose stools in the last 2-3 weeks, preceding any antibiotic rx.    Past Medical History:  Diagnosis Date   Depression    Family history of adverse reaction to anesthesia    grandmother with history of seizure during surgery reported by patient   Fracture, humerus closed    Left   Seizures ([place])    last seizure 12/2019    There are no problems to display for this patient.   Past Surgical History:  Procedure Laterality Date   BREAST LUMPECTOMY WITH RADIOACTIVE SEED LOCALIZATION Right 04/05/2020   Procedure: RIGHT BREAST LUMPECTOMY X 2 WITH RADIOACTIVE SEED LOCALIZATION;  Surgeon: Donnie Mesa, MD;  Location: [facility];  Service: General;  Laterality: Right;   HUMERUS IM NAIL Left 02/18/2013   Procedure: LEFT HUMERAL IM ROD (ANTEGRADE);  Surgeon: Alta Corning, MD;  Location: [facility];  Service: Orthopedics;  Laterality: Left;   SHOULDER SURGERY     left shoulder, rod   WISDOM TOOTH EXTRACTION      OB History   No obstetric history on file.      Home Medications    Prior to Admission medications   Medication Sig Start Date End Date Taking? Authorizing Provider  levofloxacin (LEVAQUIN) 500 MG tablet Take 1 tablet (500 mg total) by mouth daily for 7 days. 03/30/21 04/06/21 Yes Barrett Henle, MD  cefdinir (OMNICEF) 300 MG capsule Take 1 capsule (300 mg total) by mouth 2  (two) times daily. 03/22/21   Melynda Ripple, MD  fluticasone (FLONASE) 50 MCG/ACT nasal spray Place 2 sprays into both nostrils daily. 03/22/21   Melynda Ripple, MD  OXcarbazepine (TRILEPTAL) 150 MG tablet Take 150 mg by mouth 2 (two) times daily.    [provider]  phenazopyridine (PYRIDIUM) 200 MG tablet Take 1 tablet (200 mg total) by mouth 3 (three) times daily as needed for pain. 03/22/21   Melynda Ripple, MD  promethazine-dextromethorphan (PROMETHAZINE-DM) 6.25-15 MG/5ML syrup Take 5 mLs by mouth 4 (four) times daily as needed for cough. 03/22/21   Melynda Ripple, MD  sertraline (ZOLOFT) 100 MG tablet Take 100 mg by mouth in the morning and at bedtime.    [provider]  traZODone (DESYREL) 100 MG tablet Take 100 mg by mouth at bedtime.    [provider]  CLONAZEPAM PO Take 1 mg by mouth 2 (two) times daily as needed (anxiety).   01/06/20  [provider]  FLUoxetine (PROZAC) 20 MG capsule Take 20 mg by mouth daily.  01/06/20  [provider]  QUEtiapine Fumarate (SEROQUEL PO) Take 50 mg by mouth at bedtime.   01/06/20  [provider]  zolpidem (AMBIEN) 5 MG tablet Take 5 mg by mouth at bedtime as needed for sleep.  01/06/20  [provider]  Family History Family History  Problem Relation Age of Onset   Cervical cancer Mother    COPD Mother     Social History Social History   Tobacco Use   Smoking status: Every Day    Packs/day: 0.50    Types: Cigarettes   Smokeless tobacco: Never   Tobacco comments:    15 cigarettes per day  Vaping Use   Vaping Use: Never used  Substance Use Topics   Alcohol use: Yes    Comment: social   Drug use: Never     Allergies   Valproic acid, Depakote [divalproex sodium], and Divalproex sodium   Review of Systems Review of Systems  HENT:  Positive for ear pain.     Physical Exam Triage Vital Signs ED Triage Vitals  Enc Vitals Group     BP 03/30/21 1606 (!)  132/93     Pulse Rate 03/30/21 1606 70     Resp 03/30/21 1606 18     Temp 03/30/21 1606 98.3 F (36.8 C)     Temp Source 03/30/21 1606 Oral     SpO2 03/30/21 1606 98 %     Weight --      Height --      Head Circumference --      Peak Flow --      Pain Score 03/30/21 1604 7     Pain Loc --      Pain Edu? --      Excl. in [place]? --    No data found.  Updated Vital Signs BP (!) 132/93 (BP Location: Left Arm)    Pulse 70    Temp 98.3 F (36.8 C) (Oral)    Resp 18    LMP 03/17/2021    SpO2 98%   Visual Acuity Right Eye Distance:   Left Eye Distance:   Bilateral Distance:    Right Eye Near:   Left Eye Near:    Bilateral Near:     Physical Exam Vitals and nursing note reviewed.  Constitutional:      General: She is not in acute distress.    Appearance: She is well-developed.  HENT:     Left Ear: Tympanic membrane normal.     Ears:     Comments: Right TM is dull, with altered lt reflex, and is injected/pink. Maybe bulging a little.     Nose: Congestion present.  Eyes:     Extraocular Movements: Extraocular movements intact.     Conjunctiva/sclera: Conjunctivae normal.     Pupils: Pupils are equal, round, and reactive to light.  Cardiovascular:     Rate and Rhythm: Normal rate and regular rhythm.     Heart sounds: No murmur heard. Pulmonary:     Effort: Pulmonary effort is normal. No respiratory distress.     Breath sounds: Normal breath sounds.  Musculoskeletal:     Cervical back: Neck supple.  Skin:    Capillary Refill: Capillary refill takes less than 2 seconds.     Coloration: Skin is not jaundiced or pale.  Neurological:     General: No focal deficit present.     Mental Status: She is alert and oriented to person, place, and time.  Psychiatric:        Behavior: Behavior normal.     UC Treatments / Results  Labs (all labs ordered are listed, but only abnormal results are displayed) Labs Reviewed - No data to display  EKG   Radiology No results  found.  Procedures Procedures (  including critical care time)  Medications Ordered in UC Medications - No data to display  Initial Impression / Assessment and Plan / UC Course  I have reviewed the triage vital signs and the nursing notes.  Pertinent labs & imaging results that were available during my care of the patient were reviewed by me and considered in my medical decision making (see chart for details).     Here right ear looks infected, so antibiotic given to cover atypicals (she requested a Zpack).   I think she needs to see an ENT. Final Clinical Impressions(s) / UC Diagnoses   Final diagnoses:  Right otitis media, unspecified otitis media type  Acute non-recurrent maxillary sinusitis     Discharge Instructions      Your ear looks infected, and I think you have a sinus infection. I have sent levaquin as a new antibiotic (it covers things that the zpack does also, and does a better job on other bacteria).  I think you should see an ENT also; info is given on an ENT here in [place].  Continue the flonase     ED Prescriptions     Medication Sig Dispense Auth. Provider   levofloxacin (LEVAQUIN) 500 MG tablet Take 1 tablet (500 mg total) by mouth daily for 7 days. 7 tablet Lavora Brisbon, Gwenlyn Perking, MD      PDMP not reviewed this encounter.   Barrett Henle, MD 03/30/21 1655    Barrett Henle, MD 03/30/21 [phone]

## 2021-03-30 NOTE — ED Triage Notes (Signed)
Pt reports right eras pain and nasal congestion x 1 month. Reports she was treated with antibiotics 9 days ago without improvement.

## 2021-03-30 NOTE — Discharge Instructions (Addendum)
Your ear looks infected, and I think you have a sinus infection. I have sent levaquin as a new antibiotic (it covers things that the zpack does also, and does a better job on other bacteria).  I think you should see an ENT also; info is given on an ENT here in [place].  Continue the flonase

## 2021-05-05 ENCOUNTER — Encounter: Payer: Self-pay | Admitting: Emergency Medicine

## 2021-05-05 ENCOUNTER — Ambulatory Visit
Admission: EM | Admit: 2021-05-05 | Discharge: 2021-05-05 | Disposition: A | Discharge status: Home / Self Care | Payer: Self-pay | Attending: Family Medicine | Admitting: Family Medicine

## 2021-05-05 DIAGNOSIS — J209 Acute bronchitis, unspecified: Secondary | ICD-10-CM

## 2021-05-05 DIAGNOSIS — H6592 Unspecified nonsuppurative otitis media, left ear: Secondary | ICD-10-CM

## 2021-05-05 DIAGNOSIS — H66004 Acute suppurative otitis media without spontaneous rupture of ear drum, recurrent, right ear: Secondary | ICD-10-CM

## 2021-05-05 LAB — POCT RAPID STREP A (OFFICE): Rapid Strep A Screen: NEGATIVE

## 2021-05-05 MED ORDER — PROMETHAZINE-DM 6.25-15 MG/5ML PO SYRP
5.0000 mL | ORAL_SOLUTION | Freq: Four times a day (QID) | ORAL | 0 refills | Status: DC | PRN
Start: 2021-05-05 — End: 2022-06-25

## 2021-05-05 MED ORDER — AMOXICILLIN-POT CLAVULANATE 875-125 MG PO TABS: 1.0000 | ORAL_TABLET | Freq: Two times a day (BID) | ORAL | 0 refills | Status: DC

## 2021-05-05 MED ORDER — PREDNISONE 10 MG PO TABS: 10.0000 mg | ORAL_TABLET | Freq: Every day | ORAL | 0 refills | Status: AC

## 2021-05-05 NOTE — ED Triage Notes (Signed)
Pt here with sore throat and bilateral otalgia, but mostly right ear x 4 days.

## 2021-05-05 NOTE — ED Provider Notes (Signed)
[name]    CSN: [id number] Arrival date & time: 05/05/21  1620      History   Chief Complaint Chief Complaint  Patient presents with   Sore Throat   Otalgia    HPI [name] is a 36 y.o. female.   HPI Patient presents today with bilateral ear pain and throat pain x 3-4 days. Patient was seen last month and treated twice for otitis media. She felt some improvement of ear pain although doesn't feel the ear pain completely resolved. No history of recurrent strep. No known fever. She reports recent cough which is persistent and occasionally productive. Denies SOB or wheezing. Past Medical History:  Diagnosis Date   Depression    Family history of adverse reaction to anesthesia    grandmother with history of seizure during surgery reported by patient   Fracture, humerus closed    Left   Seizures ([place])    last seizure 12/2019    There are no problems to display for this patient.   Past Surgical History:  Procedure Laterality Date   BREAST LUMPECTOMY WITH RADIOACTIVE SEED LOCALIZATION Right 04/05/2020   Procedure: RIGHT BREAST LUMPECTOMY X 2 WITH RADIOACTIVE SEED LOCALIZATION;  Surgeon: Donnie Mesa, MD;  Location: [facility];  Service: General;  Laterality: Right;   HUMERUS IM NAIL Left 02/18/2013   Procedure: LEFT HUMERAL IM ROD (ANTEGRADE);  Surgeon: Alta Corning, MD;  Location: [place];  Service: Orthopedics;  Laterality: Left;   SHOULDER SURGERY     left shoulder, rod   WISDOM TOOTH EXTRACTION      OB History   No obstetric history on file.      Home Medications    Prior to Admission medications   Medication Sig Start Date End Date Taking? Authorizing Provider  amoxicillin-clavulanate (AUGMENTIN) 875-125 MG tablet Take 1 tablet by mouth 2 (two) times daily. 05/05/21  Yes Scot Jun, FNP  predniSONE (DELTASONE) 10 MG tablet Take 1 tablet (10 mg total) by mouth daily with breakfast for 5 days.  05/05/21 05/10/21 Yes Scot Jun, FNP  promethazine-dextromethorphan (PROMETHAZINE-DM) 6.25-15 MG/5ML syrup Take 5 mLs by mouth 4 (four) times daily as needed for cough. 05/05/21  Yes Scot Jun, FNP  fluticasone (FLONASE) 50 MCG/ACT nasal spray Place 2 sprays into both nostrils daily. 03/22/21   Melynda Ripple, MD  OXcarbazepine (TRILEPTAL) 150 MG tablet Take 150 mg by mouth 2 (two) times daily.    [provider]  phenazopyridine (PYRIDIUM) 200 MG tablet Take 1 tablet (200 mg total) by mouth 3 (three) times daily as needed for pain. 03/22/21   Melynda Ripple, MD  sertraline (ZOLOFT) 100 MG tablet Take 100 mg by mouth in the morning and at bedtime.    [provider]  traZODone (DESYREL) 100 MG tablet Take 100 mg by mouth at bedtime.    [provider]  CLONAZEPAM PO Take 1 mg by mouth 2 (two) times daily as needed (anxiety).   01/06/20  [provider]  FLUoxetine (PROZAC) 20 MG capsule Take 20 mg by mouth daily.  01/06/20  [provider]  QUEtiapine Fumarate (SEROQUEL PO) Take 50 mg by mouth at bedtime.   01/06/20  [provider]  zolpidem (AMBIEN) 5 MG tablet Take 5 mg by mouth at bedtime as needed for sleep.  01/06/20  [provider]    Family History Family History  Problem Relation Age of Onset   Cervical cancer Mother  COPD Mother     Social History Social History   Tobacco Use   Smoking status: Every Day    Packs/day: 0.50    Types: Cigarettes   Smokeless tobacco: Never   Tobacco comments:    15 cigarettes per day  Vaping Use   Vaping Use: Never used  Substance Use Topics   Alcohol use: Yes    Comment: social   Drug use: Never     Allergies   Valproic acid, Depakote [divalproex sodium], and Divalproex sodium   Review of Systems Review of Systems Pertinent negatives listed in HPI   Physical Exam Triage Vital Signs ED Triage Vitals [05/05/21 1704]  Enc Vitals Group     BP 122/86      Pulse Rate 87     Resp 16     Temp 98.7 F (37.1 C)     Temp Source Oral     SpO2 96 %     Weight      Height      Head Circumference      Peak Flow      Pain Score      Pain Loc      Pain Edu?      Excl. in [place]?    No data found.  Updated Vital Signs BP 122/86 (BP Location: Right Arm)    Pulse 87    Temp 98.7 F (37.1 C) (Oral)    Resp 16    SpO2 96%   Visual Acuity Right Eye Distance:   Left Eye Distance:   Bilateral Distance:    Right Eye Near:   Left Eye Near:    Bilateral Near:     Physical Exam Vitals reviewed.  Constitutional:      Appearance: She is ill-appearing. She is not toxic-appearing.  HENT:     Head: Normocephalic and atraumatic.     Right Ear: Swelling and tenderness present. A middle ear effusion is present. Tympanic membrane is erythematous.     Left Ear: Swelling and tenderness present. A middle ear effusion is present.     Mouth/Throat:     Pharynx: Pharyngeal swelling and posterior oropharyngeal erythema present.     Tonsils: No tonsillar exudate. 2+ on the right. 2+ on the left.  Eyes:     Pupils: Pupils are equal, round, and reactive to light.  Cardiovascular:     Rate and Rhythm: Normal rate and regular rhythm.  Pulmonary:     Breath sounds: Decreased air movement present. Rhonchi present. No wheezing.  Lymphadenopathy:     Cervical: Cervical adenopathy present.  Neurological:     Mental Status: She is alert.     UC Treatments / Results  Labs (all labs ordered are listed, but only abnormal results are displayed) Labs Reviewed  POCT RAPID STREP A (OFFICE) - Normal    EKG   Radiology No results found.  Procedures Procedures (including critical care time)  Medications Ordered in UC Medications - No data to display  Initial Impression / Assessment and Plan / UC Course  I have reviewed the triage vital signs and the nursing notes.  Pertinent labs & imaging results that were available during my care of the patient were  reviewed by me and considered in my medical decision making (see chart for details).    Recurrent acute otitis media, right ear Left otitis media with effusion Acute Bronchitis  Tx with Augmentin, promethazine DM, and prednisone. Strict return precautions given Advised if ear infections persists,  ENT evaluation would be indicated. RTC PRN Final Clinical Impressions(s) / UC Diagnoses   Final diagnoses:  Recurrent acute suppurative otitis media of right ear without spontaneous rupture of tympanic membrane  Left otitis media with effusion  Acute bronchitis, unspecified organism   Discharge Instructions   None    ED Prescriptions     Medication Sig Dispense Auth. Provider   predniSONE (DELTASONE) 10 MG tablet Take 1 tablet (10 mg total) by mouth daily with breakfast for 5 days. 5 tablet Scot Jun, FNP   amoxicillin-clavulanate (AUGMENTIN) 875-125 MG tablet Take 1 tablet by mouth 2 (two) times daily. 20 tablet Scot Jun, FNP   promethazine-dextromethorphan (PROMETHAZINE-DM) 6.25-15 MG/5ML syrup Take 5 mLs by mouth 4 (four) times daily as needed for cough. 180 mL Scot Jun, FNP      PDMP not reviewed this encounter.   Scot Jun, FNP 05/06/21 [phone]

## 2021-05-30 ENCOUNTER — Encounter (HOSPITAL_COMMUNITY): Payer: Self-pay

## 2022-06-25 ENCOUNTER — Encounter: Payer: Self-pay | Admitting: Neurology

## 2022-06-25 ENCOUNTER — Ambulatory Visit: Payer: Medicaid Other | Admitting: Neurology

## 2022-06-25 VITALS — BP 133/88 | HR 80 | Ht 59.0 in | Wt 163.0 lb

## 2022-06-25 DIAGNOSIS — G40009 Localization-related (focal) (partial) idiopathic epilepsy and epileptic syndromes with seizures of localized onset, not intractable, without status epilepticus: Secondary | ICD-10-CM

## 2022-06-25 MED ORDER — OXCARBAZEPINE 150 MG PO TABS: 150.0000 mg | ORAL_TABLET | Freq: Two times a day (BID) | ORAL | 3 refills | Status: DC

## 2022-06-25 NOTE — Progress Notes (Signed)
[facility]  PATIENT: [name] DOB: 10-23-1985  REQUESTING CLINICIAN: Nancie Neas, FNP HISTORY FROM: Patient  REASON FOR VISIT: Chart review    HISTORICAL  CHIEF COMPLAINT:  Chief Complaint  Patient presents with   New Patient (Initial Visit)    Rm 17. Alone. NP/Paper/[facility]/Kehinde Selinda Flavin NP/epilepsy TOC.    HISTORY OF PRESENT ILLNESS:  This is a 37 year old woman with past medical history of severe work-related injury resulting in a left humerus  fracture, anxiety, depression, and PTSD, seizures and nonepileptic seizure who is presenting to establish care.  Patient reports after her work related accident in 2014, she was diagnosed with PTSD and then started having seizures.  Seizure initially started with left arm twitching, she was aware during the seizures but there was 1 episode described as generalized convulsion with tongue biting.  Per chart review, patient was failed Depakote and Keppra but was maintained on Trileptal.  She did also have a EMU admission who captured nonepileptic events.  She has been lost to follow-up.  She reports the last time she was on Trileptal was more than 3 years ago.  She continued to have events that she described as generalized seizures, she also feel like her seizures are related to stress.  She reports that she had her last generalized convulsion in 9 months ago and again for the past 9 months she is not sure if she is having seizures or not but she noted that she has been twitching a lot and very stressed out.     Handedness: Right handed   Onset: 2017  Seizure Type: Unclear, she also has PNES   Current frequency: 2 or 3 a months   Any injuries from seizures: Hit head,   Seizure risk factors: Grandmother, Auntie  Previous ASMs: Keppra, Trileptal, Depakote   Currenty ASMs: Trileptal 150 mg BID   ASMs side effects: Denies   Brain Images: Normal MRI Brain 2014  Previous EEGs: normal EEG  2019   OTHER MEDICAL CONDITIONS: PTSD, Depression   REVIEW OF SYSTEMS: Full 14 system review of systems performed and negative with exception of: As noted in the HPI  ALLERGIES: Allergies  Allergen Reactions   Valproic Acid Hives   Bactrim [Sulfamethoxazole-Trimethoprim] Other (See Comments)   Depakote [Divalproex Sodium]    Divalproex Sodium Hives    HOME MEDICATIONS: Outpatient Medications Prior to Visit  Medication Sig Dispense Refill   traZODone (DESYREL) 100 MG tablet Take 100 mg by mouth at bedtime.     amoxicillin-clavulanate (AUGMENTIN) 875-125 MG tablet Take 1 tablet by mouth 2 (two) times daily. 20 tablet 0   fluticasone (FLONASE) 50 MCG/ACT nasal spray Place 2 sprays into both nostrils daily. 16 g 0   OXcarbazepine (TRILEPTAL) 150 MG tablet Take 150 mg by mouth 2 (two) times daily.     phenazopyridine (PYRIDIUM) 200 MG tablet Take 1 tablet (200 mg total) by mouth 3 (three) times daily as needed for pain. 6 tablet 0   promethazine-dextromethorphan (PROMETHAZINE-DM) 6.25-15 MG/5ML syrup Take 5 mLs by mouth 4 (four) times daily as needed for cough. 180 mL 0   sertraline (ZOLOFT) 100 MG tablet Take 100 mg by mouth in the morning and at bedtime.     No facility-administered medications prior to visit.    PAST MEDICAL HISTORY: Past Medical History:  Diagnosis Date   Depression    Family history of adverse reaction to anesthesia    grandmother with history of seizure during surgery reported by patient  Fracture, humerus closed    Left   Seizures ([place])    last seizure 12/2019    PAST SURGICAL HISTORY: Past Surgical History:  Procedure Laterality Date   BREAST LUMPECTOMY WITH RADIOACTIVE SEED LOCALIZATION Right 04/05/2020   Procedure: RIGHT BREAST LUMPECTOMY X 2 WITH RADIOACTIVE SEED LOCALIZATION;  Surgeon: Donnie Mesa, MD;  Location: [facility];  Service: General;  Laterality: Right;   HUMERUS IM NAIL Left 02/18/2013   Procedure: LEFT HUMERAL IM ROD  (ANTEGRADE);  Surgeon: Alta Corning, MD;  Location: [facility];  Service: Orthopedics;  Laterality: Left;   SHOULDER SURGERY     left shoulder, rod   WISDOM TOOTH EXTRACTION      FAMILY HISTORY: Family History  Problem Relation Age of Onset   Cervical cancer Mother    COPD Mother     SOCIAL HISTORY: Social History   Socioeconomic History   Marital status: Single    Spouse name: Not on file   Number of children: Not on file   Years of education: Not on file   Highest education level: Associate degree: occupational, Hotel manager, or vocational program  Occupational History   Not on file  Tobacco Use   Smoking status: Every Day    Packs/day: 0.50    Types: Cigarettes   Smokeless tobacco: Never   Tobacco comments:    15 cigarettes per day  Vaping Use   Vaping Use: Never used  Substance and Sexual Activity   Alcohol use: Yes    Comment: social   Drug use: Never   Sexual activity: Yes    Birth control/protection: None  Other Topics Concern   Not on file  Social History Narrative   ** Merged History Encounter **       Social Determinants of Health   Financial Resource Strain: Not on file  Food Insecurity: Not on file  Transportation Needs: No Transportation Needs (02/03/2020)   PRAPARE - Hydrologist (Medical): No    Lack of Transportation (Non-Medical): No  Physical Activity: Not on file  Stress: Not on file  Social Connections: Not on file  Intimate Partner Violence: Not on file    PHYSICAL EXAM  GENERAL EXAM/CONSTITUTIONAL: Vitals:  Vitals:   06/25/22 1051  BP: 133/88  Pulse: 80  Weight: 163 lb (73.9 kg)  Height: '4\' 11"'$  (1.499 m)   Body mass index is 32.92 kg/m. Wt Readings from Last 3 Encounters:  06/25/22 163 lb (73.9 kg)  04/05/20 187 lb 13.3 oz (85.2 kg)  02/03/20 184 lb (83.5 kg)   Patient is in no distress; well developed, nourished and groomed; crying throughout examination  EYES: Visual fields  full to confrontation, Extraocular movements intacts,  No results found.  MUSCULOSKELETAL: Gait, strength, tone, movements noted in Neurologic exam below  NEUROLOGIC: MENTAL STATUS:      No data to display         awake, alert, oriented to person, place and time recent and remote memory intact normal attention and concentration language fluent, comprehension intact, naming intact fund of knowledge appropriate  CRANIAL NERVE:  2nd, 3rd, 4th, 6th - Visual fields full to confrontation, extraocular muscles intact, no nystagmus 5th - facial sensation symmetric 7th - facial strength symmetric 8th - hearing intact 9th - palate elevates symmetrically, uvula midline 11th - shoulder shrug symmetric 12th - tongue protrusion midline  MOTOR:  normal bulk and tone, full strength in the BUE, BLE  SENSORY:  normal and  symmetric to light touch  COORDINATION:  finger-nose-finger, fine finger movements normal  REFLEXES:  deep tendon reflexes present and symmetric  GAIT/STATION:  normal     DIAGNOSTIC DATA (LABS, IMAGING, TESTING) - I reviewed patient records, labs, notes, testing and imaging myself where available.  Lab Results  Component Value Date   WBC 6.8 01/06/2020   HGB 14.3 01/06/2020   HCT 43.5 01/06/2020   MCV 88.1 01/06/2020   PLT 227 01/06/2020      Component Value Date/Time   NA 139 01/06/2020 1833   NA 141 02/22/2013 0526   K 3.8 01/06/2020 1833   K 3.4 (L) 02/22/2013 0526   CL 104 01/06/2020 1833   CL 109 (H) 02/22/2013 0526   CO2 25 01/06/2020 1833   CO2 27 02/22/2013 0526   GLUCOSE 99 01/06/2020 1833   GLUCOSE 86 02/22/2013 0526   BUN <5 (L) 01/06/2020 1833   BUN 5 (L) 02/22/2013 0526   CREATININE 0.78 01/06/2020 1833   CREATININE 0.68 02/22/2013 0526   CALCIUM 8.8 (L) 01/06/2020 1833   CALCIUM 8.0 (L) 02/22/2013 0526   PROT 5.8 (L) 01/06/2020 1833   ALBUMIN 3.8 01/06/2020 1833   AST 23 01/06/2020 1833   ALT 23 01/06/2020 1833   ALKPHOS 63  01/06/2020 1833   BILITOT 0.5 01/06/2020 1833   GFRNONAA >60 01/06/2020 1833   GFRNONAA >60 02/22/2013 0526   GFRAA >60 01/06/2020 1833   GFRAA >60 02/22/2013 0526   No results found for: "CHOL", "HDL", "LDLCALC", "LDLDIRECT", "TRIG" No results found for: "HGBA1C" No results found for: "VITAMINB12" Lab Results  Component Value Date   TSH 0.422 (L) 02/22/2013    Normal routine EEG 2019   ASSESSMENT AND PLAN  37 y.o. year old female  with history of PTSD and depression following a work-related injury resulting in a left humerus fracture, epileptic seizures and nonepileptic events who is presenting to establish care.  Patient was lost to follow-up to a previous neurologist due to moving to [place], now she is back in [place] and needs a new neurologist.  She has been out of her Trileptal for the past 3 years.  I will restart Trileptal 150 mg twice daily, get a routine EEG but I also strongly advised patient to set up care with a psychiatrist for management of her PTSD and depression.  She was very tearful and crying throughout the evaluation.  Of note she did have a diagnosis of nonepileptic seizures and does report that her seizures are brought on by stress, but for now, we will treat with Trileptal.  I will see her in 6 months for follow-up     1. Localization-related idiopathic epilepsy and epileptic syndromes with seizures of localized onset, not intractable, without status epilepticus ([place])     Patient Instructions  Restart Trileptal 150 mg twice daily Routine EEG Set up care with a psychiatrist for the management of few PTSD and depression Continue to follow with PCP Return in 6 months or sooner if worse     Per [facility] statutes, patients with seizures are not allowed to drive until they have been seizure-free for six months.  Other recommendations include using caution when using heavy equipment or power tools. Avoid working on ladders or at heights. Take  showers instead of baths.  Do not swim alone.  Ensure the water temperature is not too high on the home water heater. Do not go swimming alone. Do not lock yourself in a room alone (i.e.  bathroom). When caring for infants or small children, sit down when holding, feeding, or changing them to minimize risk of injury to the child in the event you have a seizure. Maintain good sleep hygiene. Avoid alcohol.  Also recommend adequate sleep, hydration, good diet and minimize stress.   During the Seizure  - First, ensure adequate ventilation and place patients on the floor on their left side  Loosen clothing around the neck and ensure the airway is patent. If the patient is clenching the teeth, do not force the mouth open with any object as this can cause severe damage - Remove all items from the surrounding that can be hazardous. The patient may be oblivious to what's happening and may not even know what he or she is doing. If the patient is confused and wandering, either gently guide him/her away and block access to outside areas - Reassure the individual and be comforting - Call 911. In most cases, the seizure ends before EMS arrives. However, there are cases when seizures may last over 3 to 5 minutes. Or the individual may have developed breathing difficulties or severe injuries. If a pregnant patient or a person with diabetes develops a seizure, it is prudent to call an ambulance. - Finally, if the patient does not regain full consciousness, then call EMS. Most patients will remain confused for about 45 to 90 minutes after a seizure, so you must use judgment in calling for help. - Avoid restraints but make sure the patient is in a bed with padded side rails - Place the individual in a lateral position with the neck slightly flexed; this will help the saliva drain from the mouth and prevent the tongue from falling backward - Remove all nearby furniture and other hazards from the area - Provide verbal  assurance as the individual is regaining consciousness - Provide the patient with privacy if possible - Call for help and start treatment as ordered by the caregiver   After the Seizure (Postictal Stage)  After a seizure, most patients experience confusion, fatigue, muscle pain and/or a headache. Thus, one should permit the individual to sleep. For the next few days, reassurance is essential. Being calm and helping reorient the person is also of importance.  Most seizures are painless and end spontaneously. Seizures are not harmful to others but can lead to complications such as stress on the lungs, brain and the heart. Individuals with prior lung problems may develop labored breathing and respiratory distress.     Orders Placed This Encounter  Procedures   EEG adult    Meds ordered this encounter  Medications   OXcarbazepine (TRILEPTAL) 150 MG tablet    Sig: Take 1 tablet (150 mg total) by mouth 2 (two) times daily.    Dispense:  180 tablet    Refill:  3    Return in about 6 months (around 12/26/2022).    Alric Ran, MD 06/25/2022, 12:28 PM  [facility] [address] [place], [place] [address] [phone]

## 2022-06-25 NOTE — Patient Instructions (Signed)
Restart Trileptal 150 mg twice daily Routine EEG Set up care with a psychiatrist for the management of few PTSD and depression Continue to follow with PCP Return in 6 months or sooner if worse

## 2022-07-11 ENCOUNTER — Other Ambulatory Visit: Payer: Medicaid Other | Admitting: *Deleted

## 2022-07-11 ENCOUNTER — Encounter: Payer: Self-pay | Admitting: *Deleted

## 2022-07-26 ENCOUNTER — Ambulatory Visit (INDEPENDENT_AMBULATORY_CARE_PROVIDER_SITE_OTHER): Payer: Medicaid Other | Admitting: Neurology

## 2022-07-26 DIAGNOSIS — G40009 Localization-related (focal) (partial) idiopathic epilepsy and epileptic syndromes with seizures of localized onset, not intractable, without status epilepticus: Secondary | ICD-10-CM

## 2022-07-26 NOTE — Procedures (Signed)
    History:  37 year old woman with seizure   EEG classification: Awake and drowsy  Description of the recording: The background rhythms of this recording consists of a fairly well modulated medium amplitude alpha rhythm of 10 Hz that is reactive to eye opening and closure. Present in the anterior head region is a 15-20 Hz beta activity. Photic stimulation was performed, did not show any abnormalities. Hyperventilation was also performed, did not show any abnormalities. Drowsiness was manifested by background fragmentation. No abnormal epileptiform discharges seen during this recording. There was no focal slowing. There were no electrographic seizure identified.   Abnormality: None   Impression: This is a normal EEG recorded while drowsy and awake. No evidence of interictal epileptiform discharges. Normal EEGs, however, do not rule out epilepsy.    Windell Norfolk, MD [facility]

## 2022-10-29 ENCOUNTER — Other Ambulatory Visit: Payer: Self-pay

## 2022-10-29 ENCOUNTER — Encounter (HOSPITAL_COMMUNITY): Payer: Self-pay

## 2022-10-29 ENCOUNTER — Emergency Department (HOSPITAL_COMMUNITY)
Admission: EM | Admit: 2022-10-29 | Discharge: 2022-10-30 | Disposition: A | Discharge status: Home / Self Care | Payer: Self-pay | Attending: Emergency Medicine | Admitting: Emergency Medicine

## 2022-10-29 ENCOUNTER — Emergency Department (HOSPITAL_COMMUNITY): Payer: Self-pay

## 2022-10-29 ENCOUNTER — Ambulatory Visit: Payer: Self-pay

## 2022-10-29 DIAGNOSIS — F17290 Nicotine dependence, other tobacco product, uncomplicated: Secondary | ICD-10-CM | POA: Insufficient documentation

## 2022-10-29 DIAGNOSIS — R0789 Other chest pain: Secondary | ICD-10-CM | POA: Insufficient documentation

## 2022-10-29 LAB — CBC
HCT: 44.1 % (ref 36.0–46.0)
Hemoglobin: 15.7 g/dL — ABNORMAL HIGH (ref 12.0–15.0)
MCH: 32.8 pg (ref 26.0–34.0)
MCHC: 35.6 g/dL (ref 30.0–36.0)
MCV: 92.1 fL (ref 80.0–100.0)
Platelets: 263 10*3/uL (ref 150–400)
RBC: 4.79 MIL/uL (ref 3.87–5.11)
RDW: 13.8 % (ref 11.5–15.5)
WBC: 8.9 10*3/uL (ref 4.0–10.5)
nRBC: 0 % (ref 0.0–0.2)

## 2022-10-29 LAB — BASIC METABOLIC PANEL
Anion gap: 8 (ref 5–15)
BUN: 7 mg/dL (ref 6–20)
CO2: 20 mmol/L — ABNORMAL LOW (ref 22–32)
Calcium: 8.7 mg/dL — ABNORMAL LOW (ref 8.9–10.3)
Chloride: 106 mmol/L (ref 98–111)
Creatinine, Ser: 0.62 mg/dL (ref 0.44–1.00)
GFR, Estimated: >60 mL/min (ref >60)
Glucose, Bld: 101 mg/dL — ABNORMAL HIGH (ref 70–99)
Potassium: 3.4 mmol/L — ABNORMAL LOW (ref 3.5–5.1)
Sodium: 134 mmol/L — ABNORMAL LOW (ref 135–145)

## 2022-10-29 LAB — HCG, SERUM, QUALITATIVE: Preg, Serum: NEGATIVE

## 2022-10-29 LAB — TROPONIN I (HIGH SENSITIVITY): Troponin I (High Sensitivity): <2 ng/L (ref <18)

## 2022-10-29 LAB — LIPASE, BLOOD: Lipase: 28 U/L (ref 11–51)

## 2022-10-29 MED ORDER — KETOROLAC TROMETHAMINE 15 MG/ML IJ SOLN
15.0000 mg | Freq: Once | INTRAMUSCULAR | Status: AC
  Administered 2022-10-29: 15 mg via INTRAVENOUS
  Filled 2022-10-29: qty 1

## 2022-10-29 MED ORDER — ONDANSETRON HCL 4 MG/2ML IJ SOLN
4.0000 mg | Freq: Once | INTRAMUSCULAR | Status: AC
  Administered 2022-10-29: 4 mg via INTRAVENOUS
  Filled 2022-10-29: qty 2

## 2022-10-29 MED ORDER — HYDROXYZINE HCL 25 MG PO TABS: 25.0000 mg | ORAL_TABLET | Freq: Once | ORAL | Status: DC

## 2022-10-29 NOTE — ED Triage Notes (Signed)
Epigastric pain x 3 days that radiates to right shoulder.   Says she works in a high stress environment but pain was so extreme it put her on the ground crying.

## 2022-10-29 NOTE — ED Provider Notes (Signed)
[place] EMERGENCY DEPARTMENT AT [facility] Provider Note   CSN: [id number] Arrival date & time: 10/29/22  2100     History {Add pertinent medical, surgical, social history, OB history to HPI:1} Chief Complaint  Patient presents with   Chest Pain    [name] is a 37 y.o. female.  The history is provided by the patient.  Chest Pain [name] is a 37 y.o. female who presents to the Emergency Department complaining of *** Chest pain, sharp, right sided chest pain shoulder blade, radiated to fron left.  No fever. Mild sob. Mild epigastric pain.  Has stress Has n, no v No d No leg swelling/pain  Hx.o sz do No hx/o ocp, d/vt pe  Gm, aunts with blood clots.   Uses tobacco.  No alcohol.  Occ marijua      Home Medications Prior to Admission medications   Medication Sig Start Date End Date Taking? Authorizing Provider  OXcarbazepine (TRILEPTAL) 150 MG tablet Take 1 tablet (150 mg total) by mouth 2 (two) times daily. 06/25/22 06/20/23  Windell Norfolk, MD  traZODone (DESYREL) 100 MG tablet Take 100 mg by mouth at bedtime.    [provider]  CLONAZEPAM PO Take 1 mg by mouth 2 (two) times daily as needed (anxiety).   01/06/20  [provider]  FLUoxetine (PROZAC) 20 MG capsule Take 20 mg by mouth daily.  01/06/20  [provider]  QUEtiapine Fumarate (SEROQUEL PO) Take 50 mg by mouth at bedtime.   01/06/20  [provider]  zolpidem (AMBIEN) 5 MG tablet Take 5 mg by mouth at bedtime as needed for sleep.  01/06/20  [provider]      Allergies    Valproic acid, Bactrim [sulfamethoxazole-trimethoprim], Depakote [divalproex sodium], and Divalproex sodium    Review of Systems   Review of Systems  Cardiovascular:  Positive for chest pain.  All other systems reviewed and are negative.   Physical Exam Updated Vital Signs BP (!) 149/115 (BP Location: Right Arm)   Pulse 90   Temp 98.3 F (36.8 C)  (Oral)   Resp 19   Ht 4\' 11"  (1.499 m)   Wt 79.4 kg   LMP 09/23/2022 (Exact Date)   SpO2 99%   BMI 35.35 kg/m  Physical Exam Vitals and nursing note reviewed.  Constitutional:      Appearance: She is well-developed.  HENT:     Head: Normocephalic and atraumatic.  Cardiovascular:     Rate and Rhythm: Normal rate and regular rhythm.     Heart sounds: No murmur heard. Pulmonary:     Effort: Pulmonary effort is normal. No respiratory distress.     Breath sounds: Normal breath sounds.  Abdominal:     Palpations: Abdomen is soft.     Tenderness: There is no abdominal tenderness. There is no guarding or rebound.  Musculoskeletal:        General: No swelling or tenderness.  Skin:    General: Skin is warm and dry.  Neurological:     Mental Status: She is alert and oriented to person, place, and time.  Psychiatric:        Behavior: Behavior normal.     ED Results / Procedures / Treatments   Labs (all labs ordered are listed, but only abnormal results are displayed) Labs Reviewed  BASIC METABOLIC PANEL - Abnormal; Notable for the following components:      Result Value   Sodium 134 (*)  Potassium 3.4 (*)    CO2 20 (*)    Glucose, Bld 101 (*)    Calcium 8.7 (*)    All other components within normal limits  CBC - Abnormal; Notable for the following components:   Hemoglobin 15.7 (*)    All other components within normal limits  HCG, SERUM, QUALITATIVE  LIPASE, BLOOD  TROPONIN I (HIGH SENSITIVITY)  TROPONIN I (HIGH SENSITIVITY)    EKG EKG Interpretation Date/Time:  Monday October 29 2022 21:11:43 EDT Ventricular Rate:  76 PR Interval:  110 QRS Duration:  72 QT Interval:  411 QTC Calculation: 463 R Axis:   70  Text Interpretation: Sinus rhythm Borderline short PR interval Anteroseptal infarct, old Baseline wander in lead(s) II aVF V1 Confirmed by Tilden Fossa [phone]) on 10/29/2022 11:06:42 PM  Radiology DG Chest 2 View  Result Date: 10/29/2022 CLINICAL DATA:   Chest pain, epigastric pain EXAM: CHEST - 2 VIEW COMPARISON:  06/30/2014 FINDINGS: Cardiac size is within normal limits. Lung fields are clear of any infiltrates or pulmonary edema. There is no pleural effusion or pneumothorax. There is faint ring-like calcific density in left mid lung field, possibly calcification in the wall of a cyst in the left breast. In the lateral view, there is a 1.9 cm ring-like calcification in the anterior chest wall. There is a intramedullary rod in left humerus from previous internal fixation. IMPRESSION: No active cardiopulmonary disease. Electronically Signed   By: Ernie Avena M.D.   On: 10/29/2022 21:43    Procedures Procedures  {Document cardiac monitor, telemetry assessment procedure when appropriate:1}  Medications Ordered in ED Medications - No data to display  ED Course/ Medical Decision Making/ A&P   {   Click here for ABCD2, HEART and other calculatorsREFRESH Note before signing :1}                          Medical Decision Making Amount and/or Complexity of Data Reviewed Labs: ordered. Radiology: ordered.   ***  {Document critical care time when appropriate:1} {Document review of labs and clinical decision tools ie heart score, Chads2Vasc2 etc:1}  {Document your independent review of radiology images, and any outside records:1} {Document your discussion with family members, caretakers, and with consultants:1} {Document social determinants of health affecting pt's care:1} {Document your decision making why or why not admission, treatments were needed:1} Final Clinical Impression(s) / ED Diagnoses Final diagnoses:  None    Rx / DC Orders ED Discharge Orders     None

## 2022-10-29 NOTE — Telephone Encounter (Signed)
  Chief Complaint: severe right shoulder pain  Symptoms: sudden onset radiating through to back and to  chest  Frequency: this am  Pertinent Negatives: Patient denies llet chest pain or radiating left chest pain  Disposition: [x] ED /[] Urgent Care (no appt availability in office) / [] Appointment(In office/virtual)/ []  [facility]/ [] Home Care/ [] Refused Recommended Disposition /[] [facility]/ []  Follow-up with PCP Additional Notes: advised to call 911/ or to be driven to ED/advised NPO? Pt crying throughout call, emotional support provided.  Reason for Disposition  [1] Pain lasting > 5 minutes AND [2] pain also present in chest  (Exception: Pain is clearly made worse by movement.)  Answer Assessment - Initial Assessment Questions 1. ONSET: "When did the pain start?"     Right shoulder pain  2. LOCATION: "Where is the pain located?"     Right shoulder pain through to back to chest  3. PAIN: "How bad is the pain?" (Scale 1-10; or mild, moderate, severe)   - MILD (1-3): doesn't interfere with normal activities   - MODERATE (4-7): interferes with normal activities (e.g., work or school) or awakens from sleep   - SEVERE (8-10): excruciating pain, unable to do any normal activities, unable to move arm at all due to pain     severe 4. WORK OR EXERCISE: "Has there been any recent work or exercise that involved this part of the body?"     no 5. CAUSE: "What do you think is causing the shoulder pain?"     no 6. OTHER SYMPTOMS: "Do you have any other symptoms?" (e.g., neck pain, swelling, rash, fever, numbness, weakness)     Back pain  Protocols used: Shoulder Pain-A-AH

## 2022-10-30 LAB — HEPATIC FUNCTION PANEL
ALT: 16 U/L (ref 0–44)
AST: 18 U/L (ref 15–41)
Albumin: 4.2 g/dL (ref 3.5–5.0)
Alkaline Phosphatase: 68 U/L (ref 38–126)
Bilirubin, Direct: 0.1 mg/dL (ref 0.0–0.2)
Indirect Bilirubin: 0.5 mg/dL (ref 0.3–0.9)
Total Bilirubin: 0.6 mg/dL (ref 0.3–1.2)
Total Protein: 6.3 g/dL — ABNORMAL LOW (ref 6.5–8.1)

## 2022-10-30 LAB — TROPONIN I (HIGH SENSITIVITY): Troponin I (High Sensitivity): <2 ng/L (ref <18)

## 2022-10-30 LAB — D-DIMER, QUANTITATIVE: D-Dimer, Quant: 0.48 ug/mL-FEU (ref 0.00–0.50)

## 2022-12-27 ENCOUNTER — Encounter: Payer: Self-pay | Admitting: Neurology

## 2022-12-27 ENCOUNTER — Ambulatory Visit: Payer: Self-pay | Admitting: Neurology

## 2023-04-07 ENCOUNTER — Emergency Department (HOSPITAL_COMMUNITY): Payer: Self-pay

## 2023-04-07 ENCOUNTER — Encounter (HOSPITAL_COMMUNITY): Payer: Self-pay

## 2023-04-07 ENCOUNTER — Emergency Department (HOSPITAL_COMMUNITY)
Admission: EM | Admit: 2023-04-07 | Discharge: 2023-04-07 | Disposition: A | Discharge status: Home / Self Care | Payer: Self-pay | Attending: Emergency Medicine | Admitting: Emergency Medicine

## 2023-04-07 ENCOUNTER — Other Ambulatory Visit: Payer: Self-pay

## 2023-04-07 DIAGNOSIS — Z1152 Encounter for screening for COVID-19: Secondary | ICD-10-CM | POA: Insufficient documentation

## 2023-04-07 DIAGNOSIS — R569 Unspecified convulsions: Secondary | ICD-10-CM | POA: Insufficient documentation

## 2023-04-07 DIAGNOSIS — J101 Influenza due to other identified influenza virus with other respiratory manifestations: Secondary | ICD-10-CM | POA: Insufficient documentation

## 2023-04-07 DIAGNOSIS — R112 Nausea with vomiting, unspecified: Secondary | ICD-10-CM | POA: Insufficient documentation

## 2023-04-07 LAB — BASIC METABOLIC PANEL
Anion gap: 12 (ref 5–15)
BUN: <5 mg/dL — ABNORMAL LOW (ref 6–20)
CO2: 20 mmol/L — ABNORMAL LOW (ref 22–32)
Calcium: 8.8 mg/dL — ABNORMAL LOW (ref 8.9–10.3)
Chloride: 103 mmol/L (ref 98–111)
Creatinine, Ser: 0.5 mg/dL (ref 0.44–1.00)
GFR, Estimated: >60 mL/min (ref >60)
Glucose, Bld: 117 mg/dL — ABNORMAL HIGH (ref 70–99)
Potassium: 3.3 mmol/L — ABNORMAL LOW (ref 3.5–5.1)
Sodium: 135 mmol/L (ref 135–145)

## 2023-04-07 LAB — CBC
HCT: 43.4 % (ref 36.0–46.0)
Hemoglobin: 16.1 g/dL — ABNORMAL HIGH (ref 12.0–15.0)
MCH: 33.1 pg (ref 26.0–34.0)
MCHC: 37.1 g/dL — ABNORMAL HIGH (ref 30.0–36.0)
MCV: 89.3 fL (ref 80.0–100.0)
Platelets: 206 10*3/uL (ref 150–400)
RBC: 4.86 MIL/uL (ref 3.87–5.11)
RDW: 13.2 % (ref 11.5–15.5)
WBC: 7.3 10*3/uL (ref 4.0–10.5)
nRBC: 0 % (ref 0.0–0.2)

## 2023-04-07 LAB — TROPONIN I (HIGH SENSITIVITY)
Troponin I (High Sensitivity): <2 ng/L (ref <18)
Troponin I (High Sensitivity): 2 ng/L (ref <18)

## 2023-04-07 LAB — RESP PANEL BY RT-PCR (RSV, FLU A&B, COVID)  RVPGX2
Influenza A by PCR: POSITIVE — AB
Influenza B by PCR: NEGATIVE
Resp Syncytial Virus by PCR: NEGATIVE
SARS Coronavirus 2 by RT PCR: NEGATIVE

## 2023-04-07 LAB — MAGNESIUM: Magnesium: 1.8 mg/dL (ref 1.7–2.4)

## 2023-04-07 LAB — HCG, SERUM, QUALITATIVE: Preg, Serum: NEGATIVE

## 2023-04-07 MED ORDER — SODIUM CHLORIDE 0.9 % IV BOLUS
500.0000 mL | Freq: Once | INTRAVENOUS | Status: AC
  Administered 2023-04-07: 500 mL via INTRAVENOUS

## 2023-04-07 MED ORDER — POTASSIUM CHLORIDE CRYS ER 20 MEQ PO TBCR
40.0000 meq | EXTENDED_RELEASE_TABLET | Freq: Once | ORAL | Status: AC
  Administered 2023-04-07: 40 meq via ORAL
  Filled 2023-04-07: qty 2

## 2023-04-07 MED ORDER — ONDANSETRON 4 MG PO TBDP: 4.0000 mg | ORAL_TABLET | Freq: Three times a day (TID) | ORAL | 0 refills | Status: DC | PRN

## 2023-04-07 MED ORDER — BENZONATATE 100 MG PO CAPS: 100.0000 mg | ORAL_CAPSULE | Freq: Three times a day (TID) | ORAL | 0 refills | Status: DC

## 2023-04-07 MED ORDER — GUAIFENESIN-DM 100-10 MG/5ML PO SYRP
5.0000 mL | ORAL_SOLUTION | Freq: Once | ORAL | Status: AC
  Administered 2023-04-07: 5 mL via ORAL
  Filled 2023-04-07: qty 10

## 2023-04-07 MED ORDER — OXCARBAZEPINE 150 MG PO TABS
150.0000 mg | ORAL_TABLET | Freq: Once | ORAL | Status: AC
  Administered 2023-04-07: 150 mg via ORAL
  Filled 2023-04-07: qty 1

## 2023-04-07 MED ORDER — ONDANSETRON HCL 4 MG/2ML IJ SOLN
4.0000 mg | Freq: Once | INTRAMUSCULAR | Status: AC
  Administered 2023-04-07: 4 mg via INTRAVENOUS
  Filled 2023-04-07: qty 2

## 2023-04-07 MED ORDER — ACETAMINOPHEN 325 MG PO TABS
650.0000 mg | ORAL_TABLET | Freq: Once | ORAL | Status: AC
  Administered 2023-04-07: 650 mg via ORAL
  Filled 2023-04-07: qty 2

## 2023-04-07 NOTE — ED Triage Notes (Signed)
Pt arrives via POV. Pt reports cough, fever, chest tightness, and body aches for the past 3-4 days. Pt reports she also had a seizure last night. Hx of seizures. Reports she hasn't had her seizure meds in over a year.

## 2023-04-07 NOTE — ED Provider Notes (Signed)
[place] EMERGENCY DEPARTMENT AT [facility] Provider Note   CSN: [id number] Arrival date & time: 04/07/23  1218     History  Chief Complaint  Patient presents with   Cough   Seizures    [name] is a 37 y.o. female.  Patient with history of seizures, depression, PTSD presents today with complaints of cough, congestion, and seizures. States that she has been feeling unwell for the past 4 days. Has been having nausea and vomiting with the cough and congestion. Her significant other is home sick with similar symptoms. Denies chest pain, shortness of breath, or abdominal pain. No fevers or chills. States that yesterday evening she got up to go to the bathroom and next thing she knew she woke up on the floor of the bathroom and had urinated on herself. States she assumed she had a seizure with this as this is how her seizures usually present. States that she was follow-up by neurology in [place], [place] however when she moved she was lost to follow-up. States that she was on Trileptal for her seizures, however she stopped taking it due to financial hardship. She tried to get in to see a neurologist here and was unable to afford it due to being uninsured. States that she has not been on tramadol in over a year.  She states she has had 3 seizures in the last year. Denies injury from the seizure yesterday. No headaches, neck pain, or vision changes.   The history is provided by the patient. No language interpreter was used.  Cough Seizures      Home Medications Prior to Admission medications   Medication Sig Start Date End Date Taking? Authorizing Provider  OXcarbazepine (TRILEPTAL) 150 MG tablet Take 1 tablet (150 mg total) by mouth 2 (two) times daily. 06/25/22 06/20/23  Windell Norfolk, MD  traZODone (DESYREL) 100 MG tablet Take 100 mg by mouth at bedtime.    [provider]  CLONAZEPAM PO Take 1 mg by mouth 2 (two) times daily as needed (anxiety).   01/06/20   [provider]  FLUoxetine (PROZAC) 20 MG capsule Take 20 mg by mouth daily.  01/06/20  [provider]  QUEtiapine Fumarate (SEROQUEL PO) Take 50 mg by mouth at bedtime.   01/06/20  [provider]  zolpidem (AMBIEN) 5 MG tablet Take 5 mg by mouth at bedtime as needed for sleep.  01/06/20  [provider]      Allergies    Valproic acid, Bactrim [sulfamethoxazole-trimethoprim], Depakote [divalproex sodium], and Divalproex sodium    Review of Systems   Review of Systems  HENT:  Positive for congestion.   Respiratory:  Positive for cough.   Gastrointestinal:  Positive for nausea and vomiting.  Neurological:  Positive for seizures.  All other systems reviewed and are negative.   Physical Exam Updated Vital Signs BP 124/85   Pulse 86   Temp 98.1 F (36.7 C) (Oral)   Resp 12   Ht 4\' 11"  (1.499 m)   Wt 79.4 kg   LMP 03/31/2023   SpO2 99%   BMI 35.35 kg/m  Physical Exam Vitals and nursing note reviewed.  Constitutional:      General: She is not in acute distress.    Appearance: Normal appearance. She is normal weight. She is not ill-appearing, toxic-appearing or diaphoretic.  HENT:     Head: Normocephalic and atraumatic.  Eyes:     Extraocular Movements: Extraocular movements intact.     Pupils:  Pupils are equal, round, and reactive to light.  Neck:     Comments: No meningismus Cardiovascular:     Rate and Rhythm: Normal rate and regular rhythm.     Heart sounds: Normal heart sounds.  Pulmonary:     Effort: Pulmonary effort is normal. No respiratory distress.     Breath sounds: Normal breath sounds.  Abdominal:     General: Abdomen is flat.     Tenderness: There is no abdominal tenderness.  Musculoskeletal:        General: Normal range of motion.     Cervical back: Normal range of motion and neck supple.     Right lower leg: No edema.     Left lower leg: No edema.  Skin:    General: Skin is warm and dry.  Neurological:      General: No focal deficit present.     Mental Status: She is alert and oriented to person, place, and time.     Cranial Nerves: No cranial nerve deficit.     Motor: No weakness.     Gait: Gait normal.  Psychiatric:        Mood and Affect: Mood normal.        Behavior: Behavior normal.     ED Results / Procedures / Treatments   Labs (all labs ordered are listed, but only abnormal results are displayed) Labs Reviewed  RESP PANEL BY RT-PCR (RSV, FLU A&B, COVID)  RVPGX2 - Abnormal; Notable for the following components:      Result Value   Influenza A by PCR POSITIVE (*)    All other components within normal limits  BASIC METABOLIC PANEL - Abnormal; Notable for the following components:   Potassium 3.3 (*)    CO2 20 (*)    Glucose, Bld 117 (*)    BUN <5 (*)    Calcium 8.8 (*)    All other components within normal limits  CBC - Abnormal; Notable for the following components:   Hemoglobin 16.1 (*)    MCHC 37.1 (*)    All other components within normal limits  HCG, SERUM, QUALITATIVE  MAGNESIUM  TROPONIN I (HIGH SENSITIVITY)  TROPONIN I (HIGH SENSITIVITY)    EKG None  Radiology DG Chest 2 View Result Date: 04/07/2023 CLINICAL DATA:  Cough, fever with chest tightness and body aches 3-4 days. Seizure last night. EXAM: CHEST - 2 VIEW COMPARISON:  10/29/2022 FINDINGS: Lungs are adequately inflated without focal airspace consolidation or effusion. Cardiomediastinal silhouette is normal. Oil cyst over the left breast soft tissue unchanged. Fixation hardware over the left humerus intact and unchanged. IMPRESSION: No active cardiopulmonary disease. Electronically Signed   By: Elberta Fortis M.D.   On: 04/07/2023 13:07    Procedures Procedures    Medications Ordered in ED Medications  potassium chloride SA (KLOR-CON M) CR tablet 40 mEq (40 mEq Oral Given 04/07/23 1618)  ondansetron (ZOFRAN) injection 4 mg (4 mg Intravenous Given 04/07/23 1618)  guaiFENesin-dextromethorphan  (ROBITUSSIN DM) 100-10 MG/5ML syrup 5 mL (5 mLs Oral Given 04/07/23 1618)  sodium chloride 0.9 % bolus 500 mL (0 mLs Intravenous Stopped 04/07/23 1711)  OXcarbazepine (TRILEPTAL) tablet 150 mg (150 mg Oral Given 04/07/23 1711)  acetaminophen (TYLENOL) tablet 650 mg (650 mg Oral Given 04/07/23 1724)    ED Course/ Medical Decision Making/ A&P  Medical Decision Making Amount and/or Complexity of Data Reviewed Labs: ordered. Radiology: ordered.  Risk OTC drugs. Prescription drug management.   This patient is a 37 y.o. female who presents to the ED for concern of cough, congestion, nausea, vomiting, seizures, this involves an extensive number of treatment options, and is a complaint that carries with it a high risk of complications and morbidity. The emergent differential diagnosis prior to evaluation includes, but is not limited to,  URI, pneumonia, seizure due to medication non-adherence . This is not an exhaustive differential.   Past Medical History / Co-morbidities / Social History:  has a past medical history of Depression, Family history of adverse reaction to anesthesia, Fracture, humerus closed, and Seizures (HCC).  Additional history: Chart reviewed. Pertinent results include: seen by [facility] in March 2024 for seizures, documented failure of Depakote and Keppra, was told to start Trileptal 150 mg BID  Physical Exam: Physical exam performed. The pertinent findings include: Alert and oriented and neurologically intact without focal deficits  Lab Tests: I ordered, and personally interpreted labs.  The pertinent results include:  K 3.3, Flu +   Imaging Studies: I ordered imaging studies including CXR. I independently visualized and interpreted imaging which showed NAD. I agree with the radiologist interpretation.   Cardiac Monitoring:  The patient was maintained on a cardiac monitor.  My attending physician Dr. Rhunette Croft viewed and  interpreted the cardiac monitored which showed an underlying rhythm of: no STEMI, sinus rhythm. I agree with this interpretation.   Medications: I ordered medication including fluids, tylenol, robitussin, zofran, trileptal, potassium, fluids  for bodyaches, cough, nausea/vomiting, seizures, hypokalemia, dehydration. Reevaluation of the patient after these medicines showed that the patient improved. I have reviewed the patients home medicines and have made adjustments as needed.  Consultations Obtained: I requested consultation with the ED pharmacist,  and discussed lab and imaging findings as well as pertinent plan - they recommend: initiated patient on Trileptal 150 mg BID, no loading dose required. TOC consult placed as well, they have called the patient, able to use a discount card to get the patient her Trileptal prescription   Disposition: After consideration of the diagnostic results and the patients response to treatment, I feel that emergency department workup does not suggest an emergent condition requiring admission or immediate intervention beyond what has been performed at this time. The plan is: discharge with zofran, tessalon, and Trileptal with driving precautions, recommendations for neurology follow-up and return precautions. After meds, patient is able to eat and drink without any residual nausea or vomiting. She feels well to go home. Symptoms likely due to the flu, likely lowered her seizure threshold as well. Social work was able to get her seizure meds at a more affordable rate which patient states she can afford. Evaluation and diagnostic testing in the emergency department does not suggest an emergent condition requiring admission or immediate intervention beyond what has been performed at this time.  Plan for discharge with close PCP follow-up.  Patient is understanding and amenable with plan, educated on red flag symptoms that would prompt immediate return.  Patient discharged in  stable condition.   This is a shared visit with supervising physician Dr. Rhunette Croft who has independently evaluated patient & provided guidance in evaluation/management/disposition, in agreement with care     Final Clinical Impression(s) / ED Diagnoses Final diagnoses:  Influenza A  Seizure (HCC)  Nausea and vomiting, unspecified vomiting type    Rx / DC Orders ED Discharge Orders  Ordered    ondansetron (ZOFRAN-ODT) 4 MG disintegrating tablet  Every 8 hours PRN        04/07/23 1808    benzonatate (TESSALON) 100 MG capsule  Every 8 hours        04/07/23 1808          An After Visit Summary was printed and given to the patient.     Vear Clock 04/07/23 1814    Derwood Kaplan, MD 04/08/23 1816

## 2023-04-07 NOTE — Discharge Instructions (Signed)
As we discussed, you tested positive for the flu today which is likely the cause of your symptoms.  It also likely lowered your seizure threshold which caused your seizure yesterday.  As the flu is a virus, no antibiotics are indicated. I recommend that you get plenty of rest and focus on symptomatic relief which includes Cepacol throat lozenges for sore throat, Mucinex DM for congestion, and tylenol/ibuprofen as needed for fevers and bodyaches. I have also given you a prescription for tessalon which is a cough suppressant medication for you to take as prescribed as needed for management of your symptoms.  We have also given you Zofran which is a nausea medication you can take as prescribed as needed for any residual nausea or vomiting.  I also recommend:  Increased fluid intake. Sports drinks offer valuable electrolytes, sugars, and fluids.  Breathing heated mist or steam (vaporizer or shower).  Eating chicken soup or other clear broths, and maintaining good nutrition.   Increasing usage of your inhaler if you have asthma.  Return to work when your temperature has returned to normal.  Gargle warm salt water and spit it out for sore throat. Take benadryl or Zyrtec to decrease sinus secretions.  Follow Up: Follow up with your primary care doctor in 5-7 days for recheck of ongoing symptoms.  Return to emergency department for emergent changing or worsening of symptoms.  Additionally, our social worker has worked very hard to be able to get your seizure medication at a more affordable rate.  Is very important that you fill this medication and take it as prescribed every day to prevent future seizures.  She is also attached information about a website to go to to help you get health insurance.  You also need to call your neurologist to schedule follow-up appointment at your earliest convenience.   Per [facility] statutes, patients with seizures are not allowed to drive until they have been  seizure-free for six months.  Other recommendations include using caution when using heavy equipment or power tools. Avoid working on ladders or at heights. Take showers instead of baths.  Do not swim alone.  Ensure the water temperature is not too high on the home water heater. Do not lock yourself in a room alone (i.e. bathroom). When caring for infants or small children, sit down when holding, feeding, or changing them to minimize risk of injury to the child in the event you have a seizure. Maintain good sleep hygiene. Avoid alcohol or other illicit drugs.  Also recommend adequate sleep, hydration, good diet and minimize stress.   During the Seizure - First, ensure adequate ventilation and place patients on the floor on their left side  Loosen clothing around the neck and ensure the airway is patent. If the patient is clenching the teeth, do not force the mouth open with any object as this can cause severe damage - Remove all items from the surrounding that can be hazardous. The patient may be oblivious to what's happening and may not even know what he or she is doing. If the patient is confused and wandering, either gently guide him/her away and block access to outside areas - Reassure the individual and be comforting - Call 911. In most cases, the seizure ends before EMS arrives. However, there are cases when seizures may last over 3 to 5 minutes. Or the individual may have developed breathing difficulties or severe injuries. If a pregnant patient or a person with diabetes develops a seizure, it  is prudent to call an ambulance. - Finally, if the patient does not regain full consciousness, then call EMS. Most patients will remain confused for about 45 to 90 minutes after a seizure, so you must use judgment in calling for help. - Avoid restraints but make sure the patient is in a bed with padded side rails - Place the individual in a lateral position with the neck slightly flexed; this will help the  saliva drain from the mouth and prevent the tongue from falling backward - Remove all nearby furniture and other hazards from the area - Provide verbal assurance as the individual is regaining consciousness - Provide the patient with privacy if possible - Call for help and start treatment as ordered by the caregiver   After the Seizure (Postictal Stage) - After a seizure, most patients experience confusion, fatigue, muscle pain and/or a headache. Thus, one should permit the individual to sleep. For the next few days, reassurance is essential. Being calm and helping reorient the person is also of importance.  - Most seizures are painless and end spontaneously. Seizures are not harmful to others but can lead to complications such as stress on the lungs, brain and the heart. Individuals with prior lung problems may develop labored breathing and respiratory distress.    Return if development of any new or worsening symptoms.

## 2023-04-07 NOTE — Care Management (Signed)
Transition of Care [facility]) - Emergency Department Mini Assessment   Patient Details  Name: [name] MRN: [id number] Date of Birth: Jun 19, 1985  Transition of Care [facility]) CM/SW Contact:    Lockie Pares, RN Phone Number: 04/07/2023, 5:02 PM   Clinical Narrative:  Patient presents with fever chills body aches and states she had a seizure last night. Has not taken her antisiezure medication in a year. [facility] [address] has a [facility]able script, cost 19.00 for a monthly supply or 30 for 3 months. Patient is employed.  Messaged with provider about any other potential medication.   ED Mini Assessment: What brought you to the Emergency Department? : Cough fever seizure  Barriers to Discharge: Inadequate or no insurance  Barrier interventions: Medication assistance          Patient Contact and Communications        ,                 Admission diagnosis:  SOB/cough/fever/seizure last night There are no active problems to display for this patient.  PCP:  Delfino Lovett, FNP Pharmacy:   [phone] [place], [place] - [address] AT [facility] [address] [place] [address] Phone: [phone] Fax: [phone]  [id number] [place], [place] - [address] AT [address] [place] [address] Phone: [phone] Fax: [phone]

## 2023-08-15 DIAGNOSIS — G40909 Epilepsy, unspecified, not intractable, without status epilepticus: Secondary | ICD-10-CM | POA: Insufficient documentation

## 2023-08-15 DIAGNOSIS — O99332 Smoking (tobacco) complicating pregnancy, second trimester: Secondary | ICD-10-CM | POA: Insufficient documentation

## 2023-08-15 LAB — OB RESULTS CONSOLE HEPATITIS B SURFACE ANTIGEN: Hepatitis B Surface Ag: NEGATIVE

## 2023-08-15 LAB — OB RESULTS CONSOLE GC/CHLAMYDIA
Chlamydia: NEGATIVE
Neisseria Gonorrhea: NEGATIVE

## 2023-08-15 LAB — OB RESULTS CONSOLE RUBELLA ANTIBODY, IGM: Rubella: NON-IMMUNE/NOT IMMUNE

## 2023-08-15 LAB — OB RESULTS CONSOLE ANTIBODY SCREEN: Antibody Screen: NEGATIVE

## 2023-08-15 LAB — OB RESULTS CONSOLE RPR: RPR: NONREACTIVE

## 2023-08-15 LAB — OB RESULTS CONSOLE HIV ANTIBODY (ROUTINE TESTING): HIV: NONREACTIVE

## 2023-08-15 LAB — OB RESULTS CONSOLE ABO/RH: RH Type: POSITIVE

## 2023-08-15 LAB — HEPATITIS C ANTIBODY: HCV Ab: NEGATIVE

## 2023-10-14 ENCOUNTER — Other Ambulatory Visit: Payer: Self-pay | Admitting: Obstetrics and Gynecology

## 2023-10-14 DIAGNOSIS — O43192 Other malformation of placenta, second trimester: Secondary | ICD-10-CM

## 2023-11-20 ENCOUNTER — Ambulatory Visit: Payer: Self-pay | Attending: Obstetrics and Gynecology | Admitting: Obstetrics

## 2023-11-20 ENCOUNTER — Ambulatory Visit: Payer: Self-pay

## 2023-11-20 ENCOUNTER — Other Ambulatory Visit: Payer: Self-pay | Admitting: *Deleted

## 2023-11-20 VITALS — BP 122/65 | HR 85

## 2023-11-20 DIAGNOSIS — O99212 Obesity complicating pregnancy, second trimester: Secondary | ICD-10-CM | POA: Diagnosis not present

## 2023-11-20 DIAGNOSIS — Z3A24 24 weeks gestation of pregnancy: Secondary | ICD-10-CM

## 2023-11-20 DIAGNOSIS — O43192 Other malformation of placenta, second trimester: Secondary | ICD-10-CM | POA: Insufficient documentation

## 2023-11-20 DIAGNOSIS — O09522 Supervision of elderly multigravida, second trimester: Secondary | ICD-10-CM | POA: Insufficient documentation

## 2023-11-20 DIAGNOSIS — Z363 Encounter for antenatal screening for malformations: Secondary | ICD-10-CM | POA: Diagnosis not present

## 2023-11-20 DIAGNOSIS — G40909 Epilepsy, unspecified, not intractable, without status epilepticus: Secondary | ICD-10-CM | POA: Diagnosis not present

## 2023-11-20 DIAGNOSIS — E669 Obesity, unspecified: Secondary | ICD-10-CM | POA: Diagnosis not present

## 2023-11-20 DIAGNOSIS — F17210 Nicotine dependence, cigarettes, uncomplicated: Secondary | ICD-10-CM

## 2023-11-20 DIAGNOSIS — O43102 Malformation of placenta, unspecified, second trimester: Secondary | ICD-10-CM

## 2023-11-20 DIAGNOSIS — O99332 Smoking (tobacco) complicating pregnancy, second trimester: Secondary | ICD-10-CM

## 2023-11-20 DIAGNOSIS — O99330 Smoking (tobacco) complicating pregnancy, unspecified trimester: Secondary | ICD-10-CM | POA: Insufficient documentation

## 2023-11-20 DIAGNOSIS — O358XX0 Maternal care for other (suspected) fetal abnormality and damage, not applicable or unspecified: Secondary | ICD-10-CM

## 2023-11-20 DIAGNOSIS — O99352 Diseases of the nervous system complicating pregnancy, second trimester: Secondary | ICD-10-CM | POA: Diagnosis not present

## 2023-11-20 NOTE — Progress Notes (Signed)
 MFM Consult Note  [name] is currently at 24 weeks and 3 days.  She was seen due to advanced maternal age (38 years old), and maternal obesity with a BMI of 33.  A placental cyst was noted during a recent ultrasound performed in your office.    The patient also reports a history of a seizure disorder that is not treated with any medications.  She reports that her last seizure was a while ago.  She is scheduled to see a neurologist next month on September 11.  She denies any problems in her current pregnancy.    She had a cell free DNA test earlier in her pregnancy which indicated a low risk for trisomy 37, 36, and 13. A female fetus is predicted.   She was informed that the fetal growth and amniotic fluid level were appropriate for her gestational age.   There were no obvious fetal anomalies noted on today's ultrasound exam.  However, today's exam was limited due to the fetal position.  The patient was informed that anomalies may be missed due to technical limitations. If the fetus is in a suboptimal position or maternal habitus is increased, visualization of the fetus in the maternal uterus may be impaired.  A simple appearing placental cyst on the fetal surface of the placenta near the fundus of her uterus was noted today.  There was no blood flow noted within the cyst.  The patient was reassured that her baby will most likely not be affected by the cyst and that the cyst may resolve later in her pregnancy.    The small risk of fetal growth issues due to the placental cyst was discussed.    We will continue to follow her with growth ultrasounds throughout her pregnancy.  Due to advanced maternal age, the patient was offered and declined an amniocentesis for definitive diagnosis of fetal aneuploidy.  She is comfortable with the low risk indicated by her cell free DNA test.    She was advised to follow-up with her neurologist.  She was reassured that should her neurologist  prescribe either Keppra or Lamictal, that these medications are safe to take during pregnancy.  A follow-up exam was scheduled in our office in 5 weeks.    The patient stated that all of her questions were answered today.  A total of 45 minutes was spent counseling and coordinating the care for this patient.  Greater than 50% of the time was spent in direct face-to-face contact.

## 2023-12-25 ENCOUNTER — Ambulatory Visit

## 2023-12-25 ENCOUNTER — Ambulatory Visit: Attending: Obstetrics

## 2023-12-26 ENCOUNTER — Encounter: Payer: Self-pay | Admitting: Neurology

## 2023-12-26 ENCOUNTER — Institutional Professional Consult (permissible substitution): Admitting: Neurology

## 2024-01-01 NOTE — Progress Notes (Deleted)
 Patient was seen on *** for Gestational Diabetes self-management class at the [facility]. The following learning objectives were met by the patient during this course:  States the definition of Gestational Diabetes States why dietary management is important in controlling blood glucose Describes the effects each nutrient has on blood glucose levels Demonstrates ability to create a balanced meal plan Demonstrates carbohydrate counting  States when to check blood glucose levels Demonstrates proper blood glucose monitoring techniques States the effect of stress and exercise on blood glucose levels States the importance of limiting caffeine and abstaining from alcohol and smoking  Blood glucose monitor given: *** Lot # *** Exp: *** Blood glucose reading: ***  *** Patient has a meter prior to visit. Patient is *** testing pre breakfast and 2 hours after each meal. FBS: *** Postprandial: *** Blood glucose today in class ***  Patient instructed to monitor glucose levels:  QID FBS: 60 - <95 1 hour: <140 2 hour: <120  *Patient received handouts: Nutrition Diabetes and Pregnancy Carbohydrate Counting List Blood glucose log Snack ideas for diabetes during pregnancy Plate Planner  Patient will be seen for follow-up as needed.

## 2024-01-02 ENCOUNTER — Encounter: Payer: Self-pay | Admitting: Obstetrics and Gynecology

## 2024-01-08 ENCOUNTER — Ambulatory Visit

## 2024-01-08 DIAGNOSIS — O24410 Gestational diabetes mellitus in pregnancy, diet controlled: Secondary | ICD-10-CM

## 2024-02-13 LAB — OB RESULTS CONSOLE GBS: GBS: NEGATIVE

## 2024-02-24 ENCOUNTER — Telehealth (HOSPITAL_COMMUNITY): Payer: Self-pay | Admitting: *Deleted

## 2024-02-25 NOTE — Telephone Encounter (Signed)
 Preadmission screen

## 2024-02-27 ENCOUNTER — Telehealth (HOSPITAL_COMMUNITY): Payer: Self-pay | Admitting: *Deleted

## 2024-02-27 NOTE — Telephone Encounter (Signed)
 Preadmission screen

## 2024-02-28 ENCOUNTER — Telehealth (HOSPITAL_COMMUNITY): Payer: Self-pay | Admitting: *Deleted

## 2024-02-28 ENCOUNTER — Encounter (HOSPITAL_COMMUNITY): Payer: Self-pay | Admitting: *Deleted

## 2024-02-28 NOTE — Telephone Encounter (Signed)
 Preadmission screen

## 2024-03-03 ENCOUNTER — Other Ambulatory Visit: Payer: Self-pay | Admitting: Obstetrics and Gynecology

## 2024-03-03 DIAGNOSIS — O24410 Gestational diabetes mellitus in pregnancy, diet controlled: Secondary | ICD-10-CM

## 2024-03-04 ENCOUNTER — Inpatient Hospital Stay (HOSPITAL_COMMUNITY): Admitting: Anesthesiology

## 2024-03-04 ENCOUNTER — Inpatient Hospital Stay (HOSPITAL_COMMUNITY)
Admission: RE | Admit: 2024-03-04 | Discharge: 2024-03-08 | DRG: 786 | Disposition: A | Discharge status: Home / Self Care | Attending: Obstetrics and Gynecology | Admitting: Obstetrics and Gynecology

## 2024-03-04 ENCOUNTER — Encounter (HOSPITAL_COMMUNITY): Payer: Self-pay | Admitting: Obstetrics and Gynecology

## 2024-03-04 ENCOUNTER — Inpatient Hospital Stay (HOSPITAL_COMMUNITY)

## 2024-03-04 ENCOUNTER — Other Ambulatory Visit: Payer: Self-pay

## 2024-03-04 DIAGNOSIS — O411230 Chorioamnionitis, third trimester, not applicable or unspecified: Secondary | ICD-10-CM | POA: Diagnosis present

## 2024-03-04 DIAGNOSIS — F17210 Nicotine dependence, cigarettes, uncomplicated: Secondary | ICD-10-CM | POA: Diagnosis present

## 2024-03-04 DIAGNOSIS — Z3A39 39 weeks gestation of pregnancy: Secondary | ICD-10-CM | POA: Diagnosis not present

## 2024-03-04 DIAGNOSIS — O134 Gestational [pregnancy-induced] hypertension without significant proteinuria, complicating childbirth: Secondary | ICD-10-CM | POA: Diagnosis present

## 2024-03-04 DIAGNOSIS — O24420 Gestational diabetes mellitus in childbirth, diet controlled: Secondary | ICD-10-CM | POA: Diagnosis present

## 2024-03-04 DIAGNOSIS — O99214 Obesity complicating childbirth: Secondary | ICD-10-CM | POA: Diagnosis present

## 2024-03-04 DIAGNOSIS — F419 Anxiety disorder, unspecified: Secondary | ICD-10-CM | POA: Diagnosis present

## 2024-03-04 DIAGNOSIS — O99334 Smoking (tobacco) complicating childbirth: Secondary | ICD-10-CM | POA: Diagnosis present

## 2024-03-04 DIAGNOSIS — E66813 Obesity, class 3: Secondary | ICD-10-CM | POA: Diagnosis present

## 2024-03-04 DIAGNOSIS — O99344 Other mental disorders complicating childbirth: Secondary | ICD-10-CM | POA: Diagnosis present

## 2024-03-04 DIAGNOSIS — O24410 Gestational diabetes mellitus in pregnancy, diet controlled: Secondary | ICD-10-CM

## 2024-03-04 DIAGNOSIS — O24419 Gestational diabetes mellitus in pregnancy, unspecified control: Principal | ICD-10-CM | POA: Diagnosis present

## 2024-03-04 LAB — CBC
HCT: 38.3 % (ref 36.0–46.0)
Hemoglobin: 13.2 g/dL (ref 12.0–15.0)
MCH: 29.5 pg (ref 26.0–34.0)
MCHC: 34.5 g/dL (ref 30.0–36.0)
MCV: 85.5 fL (ref 80.0–100.0)
Platelets: 174 K/uL (ref 150–400)
RBC: 4.48 MIL/uL (ref 3.87–5.11)
RDW: 13.5 % (ref 11.5–15.5)
WBC: 10.4 K/uL (ref 4.0–10.5)
nRBC: 0 % (ref 0.0–0.2)

## 2024-03-04 LAB — TYPE AND SCREEN
ABO/RH(D): O POS
Antibody Screen: NEGATIVE

## 2024-03-04 LAB — OB RESULTS CONSOLE HEPATITIS B SURFACE ANTIGEN: Hepatitis B Surface Ag: NEGATIVE

## 2024-03-04 LAB — GLUCOSE, CAPILLARY
Glucose-Capillary: 111 mg/dL — ABNORMAL HIGH (ref 70–99)
Glucose-Capillary: 126 mg/dL — ABNORMAL HIGH (ref 70–99)
Glucose-Capillary: 116 mg/dL — ABNORMAL HIGH (ref 70–99)
Glucose-Capillary: 93 mg/dL (ref 70–99)
Glucose-Capillary: 99 mg/dL (ref 70–99)

## 2024-03-04 LAB — OB RESULTS CONSOLE GC/CHLAMYDIA
Chlamydia: NEGATIVE
Neisseria Gonorrhea: NEGATIVE

## 2024-03-04 LAB — OB RESULTS CONSOLE HIV ANTIBODY (ROUTINE TESTING): HIV: NONREACTIVE

## 2024-03-04 LAB — PROTEIN / CREATININE RATIO, URINE
Creatinine, Urine: 90 mg/dL
Protein Creatinine Ratio: 0.26 mg/mg{creat} — ABNORMAL HIGH (ref 0.00–0.15)
Total Protein, Urine: 23 mg/dL

## 2024-03-04 LAB — RPR: RPR Ser Ql: NONREACTIVE

## 2024-03-04 LAB — OB RESULTS CONSOLE RUBELLA ANTIBODY, IGM: Rubella: NON-IMMUNE/NOT IMMUNE

## 2024-03-04 MED ORDER — OXYTOCIN-SODIUM CHLORIDE 30-0.9 UT/500ML-% IV SOLN: 2.5000 [IU]/h | INTRAVENOUS | Status: DC

## 2024-03-04 MED ORDER — EPHEDRINE 5 MG/ML INJ: 10.0000 mg | INTRAVENOUS | Status: DC | PRN

## 2024-03-04 MED ORDER — LACTATED RINGERS IV SOLN: 500.0000 mL | INTRAVENOUS | Status: AC | PRN

## 2024-03-04 MED ORDER — FENTANYL-BUPIVACAINE-NACL 0.5-0.125-0.9 MG/250ML-% EP SOLN
12.0000 mL/h | EPIDURAL | Status: DC | PRN
  Administered 2024-03-04 – 2024-03-05 (×3): 12 mL/h via EPIDURAL
  Filled 2024-03-04 (×3): qty 250

## 2024-03-04 MED ORDER — OXYTOCIN-SODIUM CHLORIDE 30-0.9 UT/500ML-% IV SOLN
1.0000 m[IU]/min | INTRAVENOUS | Status: DC
  Administered 2024-03-04: 2 m[IU]/min via INTRAVENOUS
  Administered 2024-03-05: 10 [IU] via INTRAVENOUS
  Filled 2024-03-04: qty 500

## 2024-03-04 MED ORDER — MISOPROSTOL 25 MCG QUARTER TABLET
25.0000 ug | ORAL_TABLET | ORAL | Status: DC | PRN
  Administered 2024-03-04 (×2): 25 ug via VAGINAL
  Filled 2024-03-04 (×2): qty 1

## 2024-03-04 MED ORDER — LACTATED RINGERS IV SOLN: 500.0000 mL | Freq: Once | INTRAVENOUS | Status: DC

## 2024-03-04 MED ORDER — OXYCODONE-ACETAMINOPHEN 5-325 MG PO TABS: 1.0000 | ORAL_TABLET | ORAL | Status: DC | PRN

## 2024-03-04 MED ORDER — ONDANSETRON HCL 4 MG/2ML IJ SOLN
4.0000 mg | Freq: Four times a day (QID) | INTRAMUSCULAR | Status: DC | PRN
  Administered 2024-03-04 – 2024-03-05 (×2): 4 mg via INTRAVENOUS
  Filled 2024-03-04 (×2): qty 2

## 2024-03-04 MED ORDER — ACETAMINOPHEN 325 MG PO TABS
650.0000 mg | ORAL_TABLET | ORAL | Status: DC | PRN
  Administered 2024-03-04: 650 mg via ORAL
  Filled 2024-03-04: qty 2

## 2024-03-04 MED ORDER — FENTANYL CITRATE (PF) 100 MCG/2ML IJ SOLN: 50.0000 ug | INTRAMUSCULAR | Status: DC | PRN

## 2024-03-04 MED ORDER — LIDOCAINE HCL (PF) 1 % IJ SOLN
INTRAMUSCULAR | Status: DC | PRN
  Administered 2024-03-04: 10 mL via EPIDURAL

## 2024-03-04 MED ORDER — CALCIUM CARBONATE ANTACID 500 MG PO CHEW
400.0000 mg | CHEWABLE_TABLET | ORAL | Status: DC | PRN
  Administered 2024-03-04 – 2024-03-05 (×4): 400 mg via ORAL
  Filled 2024-03-04 (×4): qty 2

## 2024-03-04 MED ORDER — DIPHENHYDRAMINE HCL 50 MG/ML IJ SOLN: 12.5000 mg | INTRAMUSCULAR | Status: DC | PRN

## 2024-03-04 MED ORDER — TERBUTALINE SULFATE 1 MG/ML IJ SOLN: 0.2500 mg | Freq: Once | INTRAMUSCULAR | Status: DC | PRN

## 2024-03-04 MED ORDER — LACTATED RINGERS IV SOLN: INTRAVENOUS | Status: AC

## 2024-03-04 MED ORDER — LIDOCAINE HCL (PF) 1 % IJ SOLN: 30.0000 mL | INTRAMUSCULAR | Status: DC | PRN

## 2024-03-04 MED ORDER — PHENYLEPHRINE 80 MCG/ML (10ML) SYRINGE FOR IV PUSH (FOR BLOOD PRESSURE SUPPORT): 80.0000 ug | PREFILLED_SYRINGE | INTRAVENOUS | Status: DC | PRN

## 2024-03-04 MED ORDER — SOD CITRATE-CITRIC ACID 500-334 MG/5ML PO SOLN
30.0000 mL | ORAL | Status: DC | PRN
  Administered 2024-03-04 – 2024-03-05 (×2): 30 mL via ORAL
  Filled 2024-03-04 (×2): qty 30

## 2024-03-04 MED ORDER — HYDROXYZINE HCL 50 MG PO TABS: 50.0000 mg | ORAL_TABLET | Freq: Four times a day (QID) | ORAL | Status: DC | PRN

## 2024-03-04 MED ORDER — OXYTOCIN BOLUS FROM INFUSION: 333.0000 mL | Freq: Once | INTRAVENOUS | Status: DC

## 2024-03-04 MED ORDER — OXYCODONE-ACETAMINOPHEN 5-325 MG PO TABS: 2.0000 | ORAL_TABLET | ORAL | Status: DC | PRN

## 2024-03-04 MED ORDER — PHENYLEPHRINE 80 MCG/ML (10ML) SYRINGE FOR IV PUSH (FOR BLOOD PRESSURE SUPPORT)
80.0000 ug | PREFILLED_SYRINGE | INTRAVENOUS | Status: AC | PRN
  Administered 2024-03-05: 80 ug via INTRAVENOUS
  Administered 2024-03-05 (×3): 160 ug via INTRAVENOUS
  Filled 2024-03-04: qty 10

## 2024-03-04 NOTE — Progress Notes (Signed)
 Spoke with patient at length about induction process and pain control options. Reviewed patient pain options and induction process - AROM and pitocin reviewed, early AROM would benefit given c/f LGA infant and avoiding EBL/infection risks associated with prolonged induction. Patient tearful, states she is just fearful of the unknown. Acknowledged feelings. Patient then spoke with anesthesia MD, is now amenable to pitocin per protocol. Wants to ambulate in hallway once prior to epidural.

## 2024-03-04 NOTE — Anesthesia Preprocedure Evaluation (Signed)
 Anesthesia Evaluation  Patient identified by MRN, date of birth, ID band Patient awake    Reviewed: Allergy & Precautions, NPO status , Patient's Chart, lab work & pertinent test results  History of Anesthesia Complications Negative for: history of anesthetic complications  Airway Mallampati: III  TM Distance: >3 FB Neck ROM: Full    Dental no notable dental hx.    Pulmonary Current Smoker and Patient abstained from smoking.   Pulmonary exam normal breath sounds clear to auscultation       Cardiovascular negative cardio ROS Normal cardiovascular exam Rhythm:Regular Rate:Normal     Neuro/Psych Seizures - (last seizure 2023), Well Controlled,  PSYCHIATRIC DISORDERS  Depression       GI/Hepatic negative GI ROS, Neg liver ROS,,,  Endo/Other  diabetes, Gestational  Class 3 obesity (BMI 43)  Renal/GU negative Renal ROS  negative genitourinary   Musculoskeletal negative musculoskeletal ROS (+)    Abdominal   Peds  Hematology negative hematology ROS (+)   Anesthesia Other Findings IOL for gDM  Reproductive/Obstetrics (+) Pregnancy                              Anesthesia Physical Anesthesia Plan  ASA: 3 and emergent  Anesthesia Plan: Epidural   Post-op Pain Management: Ofirmev  IV (intra-op)*   Induction:   PONV Risk Score and Plan: 2 and Treatment may vary due to age or medical condition, Ondansetron  and Dexamethasone   Airway Management Planned: Natural Airway  Additional Equipment:   Intra-op Plan:   Post-operative Plan:   Informed Consent: I have reviewed the patients History and Physical, chart, labs and discussed the procedure including the risks, benefits and alternatives for the proposed anesthesia with the patient or authorized representative who has indicated his/her understanding and acceptance.       Plan Discussed with: Anesthesiologist  Anesthesia Plan Comments:  (Patient identified. Risks, benefits, options discussed with patient including but not limited to bleeding, infection, nerve damage, paralysis, failed block, incomplete pain control, headache, blood pressure changes, nausea, vomiting, reactions to medication, itching, and post partum back pain. Confirmed with bedside nurse the patient's most recent platelet count. Confirmed with the patient that they are not taking any anticoagulation, have any bleeding history or any family history of bleeding disorders. Patient expressed understanding and wishes to proceed. All questions were answered. )         Anesthesia Quick Evaluation

## 2024-03-04 NOTE — H&P (Addendum)
 [name] is a 38 y.o. female presenting for scheduled IOL. Brought in at midnight. Feeling intermittent cramping, notes FM, denies VB, LOF.   PNC c/b 1) GDM diet controlled - gs at 33wks EFW 2482g/5lb8oz/63%tile, AFI 16cm, ant plac, vtx. BMI 32 at workup to 39 at admission 2) Possible mood disorder - not during pregnancy, anx/dep    *one-time seizure episode - unclear type, dx 61yrs ago, no events 1.5y, re-est appt on 12/26/23; thought to be 2/2 med mgmt regarding mood 3) Placental cyst - 2.7x2.5x3.3cm, avascular --> SImple placental cyst 5.5x3.7x3.6cm but resolved in third trimester (MFM scan noted at fundus, no internal flow) 4) Maternal tobacco use - <5/day, occ marijuana use  GBS neg OB History     Gravida  2   Para      Term      Preterm      AB  1   Living         SAB  1   IAB      Ectopic      Multiple      Live Births             Past Medical History:  Diagnosis Date   Depression    Family history of adverse reaction to anesthesia    grandmother with history of seizure during surgery reported by patient   Fracture, humerus closed    Left   Gestational diabetes    Seizures (HCC)    last seizure 12/2021   Past Surgical History:  Procedure Laterality Date   BREAST LUMPECTOMY WITH RADIOACTIVE SEED LOCALIZATION Right 04/05/2020   Procedure: RIGHT BREAST LUMPECTOMY X 2 WITH RADIOACTIVE SEED LOCALIZATION;  Surgeon: Belinda Cough, MD;  Location: [facility];  Service: General;  Laterality: Right;   HUMERUS IM NAIL Left 02/18/2013   Procedure: LEFT HUMERAL IM ROD (ANTEGRADE);  Surgeon: Norleen LITTIE Gavel, MD;  Location: [facility];  Service: Orthopedics;  Laterality: Left;   SHOULDER SURGERY     left shoulder, rod   WISDOM TOOTH EXTRACTION     Family History: family history includes COPD in her mother; Cervical cancer in her mother. Social History:  reports that she has been smoking cigarettes. She has never used smokeless  tobacco. She reports current alcohol use. She reports that she does not use drugs.     Maternal Diabetes: Yes:  Diabetes Type:  Diet controlled Genetic Screening: Normal Maternal Ultrasounds/Referrals: Normal Fetal Ultrasounds or other Referrals:  Referred to Materal Fetal Medicine  Maternal Substance Abuse:  Yes:  Type: Smoker, Marijuana Significant Maternal Medications:  None Significant Maternal Lab Results:  Group B Strep negative Number of Prenatal Visits:greater than 3 verified prenatal visits Maternal Vaccinations:TDap Other Comments:  None  Review of Systems  Constitutional:  Negative for chills and fever.  Respiratory:  Negative for shortness of breath.   Cardiovascular:  Negative for chest pain, palpitations and leg swelling.  Gastrointestinal:  Negative for abdominal pain and vomiting.  Neurological:  Negative for dizziness, weakness and headaches.  Psychiatric/Behavioral:  Negative for suicidal ideas.    Maternal Medical History:  Contractions: Frequency: irregular.   Fetal activity: Perceived fetal activity is normal.   Prenatal complications: No bleeding.   Prenatal Complications - Diabetes: gestational. Diabetes is managed by diet.     Dilation: Fingertip Effacement (%): 50 Station: -3 Exam by:: Valon Glasscock MD Blood pressure 129/83, pulse 90, temperature 97.9 F (36.6 C), temperature source Axillary, resp. rate 17, height 4'  11 (1.499 m), weight 95.9 kg, last menstrual period 06/02/2023. Exam Physical Exam Constitutional:      General: She is not in acute distress.    Appearance: She is well-developed.  HENT:     Head: Normocephalic and atraumatic.  Eyes:     Pupils: Pupils are equal, round, and reactive to light.  Cardiovascular:     Rate and Rhythm: Normal rate and regular rhythm.     Heart sounds: No murmur heard.    No gallop.  Abdominal:     Tenderness: There is no abdominal tenderness. There is no guarding or rebound.  Genitourinary:    Vagina:  Normal.  Musculoskeletal:        General: Normal range of motion.     Cervical back: Normal range of motion and neck supple.  Skin:    General: Skin is warm and dry.  Neurological:     Mental Status: She is alert and oriented to person, place, and time.     Prenatal labs: ABO, Rh: --/--/O POS (11/19 0145) Antibody: NEG (11/19 0145) Rubella: Nonimmune (11/19 0000) RPR: NON REACTIVE (11/19 0200)  HBsAg: Negative (11/19 0000)  HIV: Non-reactive (11/19 0000)  GBS: Negative/-- (10/30 0000)   Cat 1 tracing, TOCO q5-60m Assessment/Plan: This is a 38yo G2P0010 @ 39 3/7 by LMP c/w 10wk TVUS presenting for medical IOL for GDM at term, diet controlled. GBS neg. S/p PV cytotec x2 for ripening. Planning epidural, CBG q4h while in latent labor. SIUP confirmed vtx. Operative speculum placed, external os visualized. Using ring forceps, the 65fr Foley was introduced past the internal os and filled with 50cc NS. Tension applied and catheter taped to patient's inner right thigh. Patient tolerated procedure well.  Pitocin started at 2mU/min, increase once FB displaced  Lavonia HERO Kameran Lallier 03/04/2024, 10:39 AM

## 2024-03-04 NOTE — Anesthesia Procedure Notes (Signed)
 Epidural Patient location during procedure: OB Start time: 03/04/2024 9:45 PM End time: 03/04/2024 9:55 PM  Staffing Anesthesiologist: Niels Marien CROME, MD Performed: anesthesiologist   Preanesthetic Checklist Completed: patient identified, IV checked, risks and benefits discussed, monitors and equipment checked, pre-op evaluation and timeout performed  Epidural Patient position: sitting Prep: DuraPrep and site prepped and draped Patient monitoring: continuous pulse ox, blood pressure, heart rate and cardiac monitor Approach: midline Location: L3-L4 Injection technique: LOR air  Needle:  Needle type: Tuohy  Needle gauge: 17 G Needle length: 9 cm Needle insertion depth: 6 cm Catheter type: closed end flexible Catheter size: 19 Gauge Catheter at skin depth: 11 cm Test dose: negative  Assessment Sensory level: T8 Events: blood not aspirated, no cerebrospinal fluid, injection not painful, no injection resistance, no paresthesia and negative IV test  Additional Notes Patient identified. Risks/Benefits/Options discussed with patient including but not limited to bleeding, infection, nerve damage, paralysis, failed block, incomplete pain control, headache, blood pressure changes, nausea, vomiting, reactions to medication both or allergic, itching and postpartum back pain. Confirmed with bedside nurse the patient's most recent platelet count. Confirmed with patient that they are not currently taking any anticoagulation, have any bleeding history or any family history of bleeding disorders. Patient expressed understanding and wished to proceed. All questions were answered. Sterile technique was used throughout the entire procedure. Please see nursing notes for vital signs. Test dose was given through epidural catheter and negative prior to continuing to dose epidural or start infusion. Warning signs of high block given to the patient including shortness of breath, tingling/numbness in  hands, complete motor block, or any concerning symptoms with instructions to call for help. Patient was given instructions on fall risk and not to get out of bed. All questions and concerns addressed with instructions to call with any issues or inadequate analgesia.  Reason for block:procedure for pain

## 2024-03-04 NOTE — Progress Notes (Signed)
 Patient is now amenable to AROM but wants epidural placed prior to this. HA recently, 7/10, received 650mg  Tylenol . Will reassess s/p epidural BP 125/81   Pulse 88   Temp 98.3 F (36.8 C) (Axillary)   Resp 18   Ht 4' 11 (1.499 m)   Wt 95.9 kg   LMP 06/02/2023   SpO2 98%   BMI 42.70 kg/m  CBC and CMP drawn, uPC after Foley

## 2024-03-04 NOTE — Progress Notes (Signed)
 Foley bulb displaced at 1715. Patient very tearful with cramping/contractions. Advised next stesp are AROM and pitocin. Given pt pain level, offered epidural prior to next steps. Patient tearful and anxious, wishes to speak with husband prior to decision BP (!) 150/80   Pulse 82   Temp 97.9 F (36.6 C) (Axillary)   Resp 18   Ht 4' 11 (1.499 m)   Wt 95.9 kg   LMP 06/02/2023   SpO2 98%   BMI 42.70 kg/m  Suspect that elevated BP is 2/2 anxiety but will continue to monitor, labs if persistent

## 2024-03-04 NOTE — Progress Notes (Signed)
 Labor Note  S: Patient now much more comfortable s/p epidural. No HA, denies VB.   O: BP 135/85   Pulse 74   Temp 98.3 F (36.8 C) (Axillary)   Resp 18   Ht 4' 11 (1.499 m)   Wt 95.9 kg   LMP 06/02/2023   SpO2 99%   BMI 42.70 kg/m  CE: Clear AROm @ 2245 with copious fluid return, 1-2/90/-2 (Prior exam was palpating more into LUS. With proper analgesia, after AROM, more accurate CE was obtained)  FHR: Baseline 135, +accels, -decels, mod variability TOCO q2-3, pitocin at 10mU/min  A/P: This is a 38 y.o. G2P0010 at [redacted]w[redacted]d  admitted for IOL for GDMA1. GBS neg. New criteria of GHTN based on intermittently elevated blood pressures, non severe range FWB: cat 1 tracing MWB: getting comfortable s/p epidural, asymptomatic from epidural standpoint Labor course: S/p FB for ripening. Though bulb was placed well past internal os, CE is now currently only 1-2/90/-2. Patient s/p AROM. Informed patient of more accurate exam at this time and will continue to titrate pitocin per protocol.   Anticipate SVD

## 2024-03-05 ENCOUNTER — Encounter (HOSPITAL_COMMUNITY): Payer: Self-pay | Admitting: Obstetrics and Gynecology

## 2024-03-05 ENCOUNTER — Encounter (HOSPITAL_COMMUNITY)
Admission: RE | Disposition: A | Discharge status: Home / Self Care | Payer: Self-pay | Source: Home / Self Care | Attending: Obstetrics and Gynecology

## 2024-03-05 LAB — GLUCOSE, CAPILLARY
Glucose-Capillary: 100 mg/dL — ABNORMAL HIGH (ref 70–99)
Glucose-Capillary: 101 mg/dL — ABNORMAL HIGH (ref 70–99)
Glucose-Capillary: 101 mg/dL — ABNORMAL HIGH (ref 70–99)
Glucose-Capillary: 110 mg/dL — ABNORMAL HIGH (ref 70–99)
Glucose-Capillary: 125 mg/dL — ABNORMAL HIGH (ref 70–99)
Glucose-Capillary: 87 mg/dL (ref 70–99)
Glucose-Capillary: 87 mg/dL (ref 70–99)

## 2024-03-05 MED ORDER — ONDANSETRON HCL 4 MG/2ML IJ SOLN
INTRAMUSCULAR | Status: DC | PRN
  Administered 2024-03-05: 4 mg via INTRAVENOUS

## 2024-03-05 MED ORDER — SIMETHICONE 80 MG PO CHEW
80.0000 mg | CHEWABLE_TABLET | ORAL | Status: DC | PRN
  Administered 2024-03-07: 80 mg via ORAL

## 2024-03-05 MED ORDER — SODIUM CHLORIDE 0.9 % IV SOLN
2.0000 g | Freq: Four times a day (QID) | INTRAVENOUS | Status: DC
  Administered 2024-03-05: 2 g via INTRAVENOUS
  Filled 2024-03-05: qty 2000

## 2024-03-05 MED ORDER — DEXMEDETOMIDINE HCL IN NACL 80 MCG/20ML IV SOLN
INTRAVENOUS | Status: DC | PRN
  Administered 2024-03-05 (×2): 8 ug via INTRAVENOUS

## 2024-03-05 MED ORDER — STERILE WATER FOR IRRIGATION IR SOLN
Status: DC | PRN
  Administered 2024-03-05: 1

## 2024-03-05 MED ORDER — MENTHOL 3 MG MT LOZG: 1.0000 | LOZENGE | OROMUCOSAL | Status: DC | PRN

## 2024-03-05 MED ORDER — LACTATED RINGERS IV SOLN
500.0000 mL | INTRAVENOUS | Status: DC | PRN
  Administered 2024-03-05 (×2): 500 mL via INTRAVENOUS

## 2024-03-05 MED ORDER — WITCH HAZEL-GLYCERIN EX PADS: 1.0000 | MEDICATED_PAD | CUTANEOUS | Status: DC | PRN

## 2024-03-05 MED ORDER — CEFAZOLIN SODIUM-DEXTROSE 2-4 GM/100ML-% IV SOLN: 2.0000 g | Freq: Once | INTRAVENOUS | Status: DC

## 2024-03-05 MED ORDER — DEXAMETHASONE SOD PHOSPHATE PF 10 MG/ML IJ SOLN: INTRAMUSCULAR | Status: DC | PRN

## 2024-03-05 MED ORDER — SODIUM CHLORIDE 0.9 % IV SOLN
2.0000 g | Freq: Four times a day (QID) | INTRAVENOUS | Status: AC
  Administered 2024-03-06 (×4): 2 g via INTRAVENOUS
  Filled 2024-03-05 (×4): qty 2000

## 2024-03-05 MED ORDER — FENTANYL CITRATE (PF) 100 MCG/2ML IJ SOLN
INTRAMUSCULAR | Status: AC
  Filled 2024-03-05: qty 2

## 2024-03-05 MED ORDER — COCONUT OIL OIL: 1.0000 | TOPICAL_OIL | Status: DC | PRN

## 2024-03-05 MED ORDER — MORPHINE SULFATE (PF) 0.5 MG/ML IJ SOLN
INTRAMUSCULAR | Status: DC | PRN
  Administered 2024-03-05: 150 ug via INTRATHECAL

## 2024-03-05 MED ORDER — ZOLPIDEM TARTRATE 5 MG PO TABS: 5.0000 mg | ORAL_TABLET | Freq: Every evening | ORAL | Status: DC | PRN

## 2024-03-05 MED ORDER — CEFAZOLIN SODIUM-DEXTROSE 2-3 GM-%(50ML) IV SOLR
INTRAVENOUS | Status: DC | PRN
  Administered 2024-03-05: 2 g via INTRAVENOUS

## 2024-03-05 MED ORDER — OXYTOCIN-SODIUM CHLORIDE 30-0.9 UT/500ML-% IV SOLN
2.5000 [IU]/h | INTRAVENOUS | Status: AC
  Administered 2024-03-06: 2.5 [IU]/h via INTRAVENOUS
  Filled 2024-03-05: qty 500

## 2024-03-05 MED ORDER — SODIUM CHLORIDE 0.9% FLUSH: 3.0000 mL | INTRAVENOUS | Status: DC | PRN

## 2024-03-05 MED ORDER — FENTANYL CITRATE (PF) 100 MCG/2ML IJ SOLN
INTRAMUSCULAR | Status: DC | PRN
  Administered 2024-03-05: 15 ug via INTRATHECAL

## 2024-03-05 MED ORDER — GENTAMICIN SULFATE 40 MG/ML IJ SOLN
330.0000 mg | Freq: Once | INTRAVENOUS | Status: AC
  Administered 2024-03-05: 330 mg via INTRAVENOUS
  Filled 2024-03-05: qty 8.25

## 2024-03-05 MED ORDER — DEXAMETHASONE SOD PHOSPHATE PF 10 MG/ML IJ SOLN
INTRAMUSCULAR | Status: DC | PRN
  Administered 2024-03-05: 10 mg via INTRAVENOUS

## 2024-03-05 MED ORDER — GENTAMICIN SULFATE 40 MG/ML IJ SOLN
330.0000 mg | INTRAVENOUS | Status: AC
  Administered 2024-03-06: 330 mg via INTRAVENOUS
  Filled 2024-03-05: qty 8.25

## 2024-03-05 MED ORDER — SENNOSIDES-DOCUSATE SODIUM 8.6-50 MG PO TABS
2.0000 | ORAL_TABLET | Freq: Every day | ORAL | Status: DC
  Administered 2024-03-06 – 2024-03-08 (×3): 2 via ORAL
  Filled 2024-03-05 (×3): qty 2

## 2024-03-05 MED ORDER — LACTATED RINGERS IV SOLN: INTRAVENOUS | Status: DC

## 2024-03-05 MED ORDER — KETOROLAC TROMETHAMINE 30 MG/ML IJ SOLN
30.0000 mg | Freq: Four times a day (QID) | INTRAMUSCULAR | Status: DC | PRN
  Administered 2024-03-06: 30 mg via INTRAVENOUS

## 2024-03-05 MED ORDER — SODIUM CHLORIDE 0.9 % IV SOLN
500.0000 mg | Freq: Once | INTRAVENOUS | Status: AC
  Administered 2024-03-05: 500 mg via INTRAVENOUS

## 2024-03-05 MED ORDER — DROPERIDOL 2.5 MG/ML IJ SOLN: 0.6250 mg | Freq: Once | INTRAMUSCULAR | Status: DC | PRN

## 2024-03-05 MED ORDER — TRANEXAMIC ACID-NACL 1000-0.7 MG/100ML-% IV SOLN
1000.0000 mg | INTRAVENOUS | Status: AC
  Administered 2024-03-05: 1000 mg via INTRAVENOUS

## 2024-03-05 MED ORDER — ACETAMINOPHEN 500 MG PO TABS
1000.0000 mg | ORAL_TABLET | Freq: Four times a day (QID) | ORAL | Status: DC
  Administered 2024-03-05 – 2024-03-08 (×11): 1000 mg via ORAL
  Filled 2024-03-05 (×12): qty 2

## 2024-03-05 MED ORDER — PRENATAL MULTIVITAMIN CH
1.0000 | ORAL_TABLET | Freq: Every day | ORAL | Status: DC
  Administered 2024-03-06 – 2024-03-08 (×3): 1 via ORAL
  Filled 2024-03-05 (×3): qty 1

## 2024-03-05 MED ORDER — DIPHENHYDRAMINE HCL 50 MG/ML IJ SOLN: 12.5000 mg | INTRAMUSCULAR | Status: DC | PRN

## 2024-03-05 MED ORDER — BUPIVACAINE HCL (PF) 0.25 % IJ SOLN
INTRAMUSCULAR | Status: DC | PRN
  Administered 2024-03-05: 3 mL via EPIDURAL
  Administered 2024-03-05 (×3): 5 mL via EPIDURAL
  Administered 2024-03-05: 10 mL via EPIDURAL

## 2024-03-05 MED ORDER — DIPHENHYDRAMINE HCL 25 MG PO CAPS: 25.0000 mg | ORAL_CAPSULE | ORAL | Status: DC | PRN

## 2024-03-05 MED ORDER — ONDANSETRON HCL 4 MG/2ML IJ SOLN: 4.0000 mg | Freq: Three times a day (TID) | INTRAMUSCULAR | Status: DC | PRN

## 2024-03-05 MED ORDER — MORPHINE SULFATE (PF) 0.5 MG/ML IJ SOLN
INTRAMUSCULAR | Status: AC
  Filled 2024-03-05: qty 10

## 2024-03-05 MED ORDER — KETOROLAC TROMETHAMINE 30 MG/ML IJ SOLN
30.0000 mg | Freq: Four times a day (QID) | INTRAMUSCULAR | Status: AC
  Administered 2024-03-06 (×2): 30 mg via INTRAVENOUS
  Filled 2024-03-05 (×4): qty 1

## 2024-03-05 MED ORDER — BUPIVACAINE IN DEXTROSE 0.75-8.25 % IT SOLN
INTRATHECAL | Status: DC | PRN
  Administered 2024-03-05: 1.4 mL via INTRATHECAL

## 2024-03-05 MED ORDER — KETOROLAC TROMETHAMINE 30 MG/ML IJ SOLN: 30.0000 mg | Freq: Four times a day (QID) | INTRAMUSCULAR | Status: DC | PRN

## 2024-03-05 MED ORDER — DIBUCAINE (PERIANAL) 1 % EX OINT: 1.0000 | TOPICAL_OINTMENT | CUTANEOUS | Status: DC | PRN

## 2024-03-05 MED ORDER — ACETAMINOPHEN 500 MG PO TABS: 1000.0000 mg | ORAL_TABLET | Freq: Four times a day (QID) | ORAL | Status: DC

## 2024-03-05 MED ORDER — NALOXONE HCL 0.4 MG/ML IJ SOLN: 0.4000 mg | INTRAMUSCULAR | Status: DC | PRN

## 2024-03-05 MED ORDER — ACETAMINOPHEN 500 MG PO TABS
1000.0000 mg | ORAL_TABLET | Freq: Four times a day (QID) | ORAL | Status: AC | PRN
  Administered 2024-03-05: 1000 mg via ORAL
  Filled 2024-03-05: qty 2

## 2024-03-05 MED ORDER — IBUPROFEN 600 MG PO TABS: 600.0000 mg | ORAL_TABLET | Freq: Four times a day (QID) | ORAL | Status: DC

## 2024-03-05 MED ORDER — KETOROLAC TROMETHAMINE 30 MG/ML IJ SOLN
INTRAMUSCULAR | Status: AC
  Filled 2024-03-05: qty 1

## 2024-03-05 MED ORDER — SIMETHICONE 80 MG PO CHEW
80.0000 mg | CHEWABLE_TABLET | Freq: Three times a day (TID) | ORAL | Status: DC
  Administered 2024-03-06 – 2024-03-08 (×6): 80 mg via ORAL
  Filled 2024-03-05 (×8): qty 1

## 2024-03-05 MED ORDER — DIPHENHYDRAMINE HCL 25 MG PO CAPS: 25.0000 mg | ORAL_CAPSULE | Freq: Four times a day (QID) | ORAL | Status: DC | PRN

## 2024-03-05 MED ORDER — FENTANYL CITRATE (PF) 100 MCG/2ML IJ SOLN
25.0000 ug | INTRAMUSCULAR | Status: DC | PRN
  Administered 2024-03-05: 50 ug via INTRAVENOUS

## 2024-03-05 MED ORDER — ENOXAPARIN SODIUM 40 MG/0.4ML IJ SOSY
40.0000 mg | PREFILLED_SYRINGE | INTRAMUSCULAR | Status: DC
  Administered 2024-03-06 – 2024-03-08 (×3): 40 mg via SUBCUTANEOUS
  Filled 2024-03-05 (×3): qty 0.4

## 2024-03-05 MED ORDER — NALOXONE HCL 4 MG/10ML IJ SOLN: 1.0000 ug/kg/h | INTRAVENOUS | Status: DC | PRN

## 2024-03-05 MED ORDER — KETOROLAC TROMETHAMINE 30 MG/ML IJ SOLN
30.0000 mg | Freq: Once | INTRAMUSCULAR | Status: AC
  Administered 2024-03-05: 30 mg via INTRAVENOUS

## 2024-03-05 MED ORDER — PHENYLEPHRINE HCL-NACL 20-0.9 MG/250ML-% IV SOLN
INTRAVENOUS | Status: DC | PRN
Start: 2024-03-05 — End: 2024-03-05
  Administered 2024-03-05: 60 ug/min via INTRAVENOUS

## 2024-03-05 MED ORDER — GENTAMICIN SULFATE 40 MG/ML IJ SOLN: 330.0000 mg | INTRAVENOUS | Status: DC

## 2024-03-05 MED ORDER — OXYCODONE HCL 5 MG PO TABS
5.0000 mg | ORAL_TABLET | ORAL | Status: DC | PRN
  Administered 2024-03-06 – 2024-03-07 (×5): 10 mg via ORAL
  Administered 2024-03-07 (×2): 5 mg via ORAL
  Administered 2024-03-07: 10 mg via ORAL
  Administered 2024-03-08 (×2): 5 mg via ORAL
  Administered 2024-03-08: 10 mg via ORAL
  Filled 2024-03-05: qty 2
  Filled 2024-03-05: qty 1
  Filled 2024-03-05 (×5): qty 2
  Filled 2024-03-05 (×2): qty 1
  Filled 2024-03-05: qty 2
  Filled 2024-03-05: qty 1

## 2024-03-05 SURGICAL SUPPLY — 29 items
CHLORAPREP W/TINT 26 (MISCELLANEOUS) ×2 IMPLANT
CLAMP UMBILICAL CORD (MISCELLANEOUS) ×1 IMPLANT
CLOTH BEACON ORANGE TIMEOUT ST (SAFETY) ×1 IMPLANT
DERMABOND ADVANCED .7 DNX12 (GAUZE/BANDAGES/DRESSINGS) IMPLANT
DRSG OPSITE POSTOP 4X10 (GAUZE/BANDAGES/DRESSINGS) ×1 IMPLANT
ELECTRODE REM PT RTRN 9FT ADLT (ELECTROSURGICAL) ×1 IMPLANT
EXTRACTOR VACUUM KIWI (MISCELLANEOUS) IMPLANT
GLOVE BIO SURGEON STRL SZ7 (GLOVE) ×1 IMPLANT
GLOVE BIOGEL PI IND STRL 7.0 (GLOVE) ×1 IMPLANT
GOWN STRL REUS W/TWL LRG LVL3 (GOWN DISPOSABLE) ×2 IMPLANT
KIT ABG SYR 3ML LUER SLIP (SYRINGE) IMPLANT
MAT PREVALON FULL STRYKER (MISCELLANEOUS) IMPLANT
NDL HYPO 25X5/8 SAFETYGLIDE (NEEDLE) IMPLANT
NEEDLE HYPO 22GX1.5 SAFETY (NEEDLE) IMPLANT
NS IRRIG 1000ML POUR BTL (IV SOLUTION) ×1 IMPLANT
PACK C SECTION WH (CUSTOM PROCEDURE TRAY) ×1 IMPLANT
PAD OB MATERNITY 4.3X12.25 (PERSONAL CARE ITEMS) ×1 IMPLANT
RETAINER VISCERAL (MISCELLANEOUS) IMPLANT
RTRCTR C-SECT PINK 25CM LRG (MISCELLANEOUS) ×1 IMPLANT
SUT CHROMIC 1 CTX 36 (SUTURE) ×2 IMPLANT
SUT CHROMIC 2 0 CT 1 (SUTURE) ×1 IMPLANT
SUT PDS AB 0 CTX 60 (SUTURE) ×1 IMPLANT
SUT VIC AB 2-0 CT1 TAPERPNT 27 (SUTURE) ×1 IMPLANT
SUT VIC AB 4-0 KS 27 (SUTURE) IMPLANT
SUTURE PLAIN GUT 2.0 ETHICON (SUTURE) IMPLANT
SYR 30ML LL (SYRINGE) IMPLANT
TOWEL OR 17X24 6PK STRL BLUE (TOWEL DISPOSABLE) ×1 IMPLANT
TRAY FOLEY W/BAG SLVR 14FR LF (SET/KITS/TRAYS/PACK) ×1 IMPLANT
WATER STERILE IRR 1000ML POUR (IV SOLUTION) ×1 IMPLANT

## 2024-03-05 NOTE — Anesthesia Postprocedure Evaluation (Signed)
 Anesthesia Post Note  Patient: [name]  Procedure(s) Performed: CESAREAN DELIVERY     Patient location during evaluation: PACU Anesthesia Type: Spinal Level of consciousness: awake and alert Pain management: pain level controlled Vital Signs Assessment: post-procedure vital signs reviewed and stable Respiratory status: spontaneous breathing, nonlabored ventilation and respiratory function stable Cardiovascular status: blood pressure returned to baseline Postop Assessment: no apparent nausea or vomiting, spinal receding, no headache and no backache Anesthetic complications: no   No notable events documented.  Last Vitals:  Vitals:   03/05/24 2144 03/05/24 2200  BP: 102/74 (!) 117/101  Pulse: 96 92  Resp: 14 17  Temp:    SpO2: 95% 92%    Last Pain:  Vitals:   03/05/24 2218  TempSrc:   PainSc: 5    Pain Goal: Patients Stated Pain Goal: 2 (03/04/24 0630)  LLE Motor Response: Purposeful movement (03/05/24 2144) LLE Sensation: Tingling (03/05/24 2144) RLE Motor Response: Non-purposeful movement (03/05/24 2144) RLE Sensation: Tingling (03/05/24 2144)     Epidural/Spinal Function Cutaneous sensation: Tingles (03/05/24 2144), Patient able to flex knees: Yes (One leg) (03/05/24 2144), Patient able to lift hips off bed: No (03/05/24 2144), Back pain beyond tenderness at insertion site: No (03/05/24 2144), Progressively worsening motor and/or sensory loss: No (03/05/24 2144), Bowel and/or bladder incontinence post epidural: No (03/05/24 2144)  Vertell Row

## 2024-03-05 NOTE — Progress Notes (Signed)
 Labor Note  S: Patient resting comfortably. As per RN note (I was informed) patient declined placement of IUPC at approx 0215  O: BP (!) 139/94   Pulse 96   Temp 99 F (37.2 C) (Oral)   Resp 18   Ht 4' 11 (1.499 m)   Wt 95.9 kg   LMP 06/02/2023   SpO2 99%   BMI 42.70 kg/m  CE: Unchanged at tight 1-2/90/-2, IUPC placed after discussion with patient FHR: Baseline 140, -accels, -decels, min to mod variability TOCO q3, pitocin  at 3mU/min  A/P: This is a 38 y.o. G2P0010 at [redacted]w[redacted]d  admitted for IOL for GDMA1. GBS neg. New criteria of GHTN based on intermittently elevated blood pressures, non severe range FWB: cat 1 tracing MWB: getting comfortable s/p epidural, asymptomatic from epidural standpoint    *GHTN: Labs WNL, uPC 0.26. Normotensive since epidural placement however last BP with DBP of 94 Labor course: S/p FB for ripening. S/p AROM at 2246. IUPC placed at this time. Briefly discussed definitions of failed IOL per patient request but advised have not been met at this time  Anticipate SVD

## 2024-03-05 NOTE — Progress Notes (Signed)
 Patient ID: [name], female   DOB: 09-23-1985, 38 y.o.   MRN: [id number]  S) overall comfortable with epidural if remains in Semi-Fowler's.  If [name] rotates to the right or the left patient reports recurrent pain.  Epidural medication bag is now requiring second replacement due to frequent redosing and PCA dosings.  And has significant anxiety regarding pain O)  Vitals:   03/05/24 1700 03/05/24 1725 03/05/24 1726 03/05/24 1730  BP: (!) 133/94   (!) 132/91  Pulse:    (!) 106  Resp:      Temp:  (!) 101.8 F (38.8 C) (!) 101.5 F (38.6 C)   TempSrc:  Axillary Oral   SpO2:      Weight:      Height:       Alert and oriented x 3, tearful and anxious appearing Gravid, soft Fetal heart rate 150, minimal variability, positive scalp stim with cervical exam Cervix 2 to 3/80/0 Toco every 2 2-4 irregular  A/P 1) minimal progression in labor in the last 24+ hours.  IUPC in place.  M VU not adequate however on 24 milliunits of Pitocin.  IUPC was replaced and no significant change in MVUs.  Discussed difficulty in achieving adequate labor despite high doses of Pitocin.  Patient reluctant to rotate in the bed due to an increase in perception of discomfort with epidural. 2) discussed with patient fetal tracing is category 2 and overall reassuring with results scalp stem however if fetal tracing remains unchanged a prolonged labor course be ideal.  3) discussed poor labor progress and potential management options.  Discussed Pitocin break and replacing epidural for more adequate anesthesia.  Versus continuing current course versus proceeding with cesarean delivery.  Patient exceedingly anxious about possibility of C-section.  Discussed the preference to defer anxiolytic medications for the patient until after delivery of the baby and cord clamping.  Patient does not want to proceed with cesarean at this time.  Addendum: Patient now febrile to 101.8.  Ampicillin and gentamicin ordered for  chorioamnionitis.  Tylenol  1000 mg ordered as antipyretic

## 2024-03-05 NOTE — Progress Notes (Signed)
 Patient ID: [name], female   DOB: 1985-05-23, 38 y.o.   MRN: [id number]  S) patient anxious comfortable O)  Vitals:   03/05/24 1726 03/05/24 1730 03/05/24 1800 03/05/24 1830  BP:  (!) 132/91 135/77 119/65  Pulse:  (!) 106 (!) 103 (!) 115  Resp:      Temp: (!) 101.5 F (38.6 C)   (!) 100.9 F (38.3 C)  TempSrc: Oral   Axillary  SpO2:      Weight:      Height:       AO x 3, anxious Gravid, soft 150, minimal variability, variable decel with cervical exam Cervix 2-3/80/-2 Toco irregular  Assessment and plan Discussion with patient regarding fetal tracing.  No significant improvement.  Small variable decel with cervical exam previously had positive scalp stim.  Fever now improved with Tylenol  and antibiotics.  No cervical change.  Discussed with patient concern for fetal tracing.  Given minimal variability with lack of scalp stim with cervical exam in the presence of no chorioamnionitis and no cervical change discussed more pressing need to proceed with cesarean delivery for nonreassuring fetal wellbeing.  Risks/benefits/alternatives of cesarean delivery reiterated.  Informed consent obtained.

## 2024-03-05 NOTE — Transfer of Care (Signed)
 Immediate Anesthesia Transfer of Care Note  Patient: [name]  Procedure(s) Performed: CESAREAN DELIVERY  Patient Location: PACU  Anesthesia Type:Spinal  Level of Consciousness: awake, alert , and oriented  Airway & Oxygen Therapy: Patient Spontanous Breathing  Post-op Assessment: Report given to RN and Post -op Vital signs reviewed and stable  Post vital signs: Reviewed and stable  Last Vitals:  Vitals Value Taken Time  BP    Temp 36.7 C 03/05/24 21:23  Pulse 94 03/05/24 21:28  Resp 18 03/05/24 21:28  SpO2 95 % 03/05/24 21:28  Vitals shown include unfiled device data.  Last Pain:  Vitals:   03/05/24 2123  TempSrc: Oral  PainSc:       Patients Stated Pain Goal: 2 (03/04/24 0630)  Complications: No notable events documented.

## 2024-03-05 NOTE — Progress Notes (Signed)
 At this time, this RN explained to the patient the difficulty experienced trying to trace her ctx. This RN discussed the purpose of the IUPC in order to accurately and effectively trace ctx better. This RN explained the risk of not accurately assessing and the potential consequences. The patient declined the IUPC.

## 2024-03-05 NOTE — Op Note (Signed)
 Operative Report   Pre-Operative Diagnosis: 1) 39+4-week intrauterine pregnancy 2) nonreassuring fetal wellbeing 3) chorioamnionitis 4) failure to progress 5) BMI 42.7  Postoperative Diagnosis:  1) 39+4-week intrauterine pregnancy 2) nonreassuring fetal wellbeing 3) chorioamnionitis 4) failure to progress 5) BMI 42.7  Procedure: Primary low-transverse cesarean section  Surgeon: Dr. Marjorie Gull  Assistant: None  Antibiotics: Ancef  2 g, azithromycin 500 mg  Operative Findings: Female infant in the vertex presentation with Apgar scores of 5 at 1 minute and 8 at 5 minutes.  Extension of the uterine incision vertically inferior to the hysterotomy toward the cervix in the midline.  Normal-appearing ovaries and tubes.  Cord pH 7.13  Specimen: Placenta to pathology  ZAO:[place]ujo I/O In: 1250 [I.V.:1000; IV Piggyback:250] Out: 1010 [Blood:1010]   Procedure:[name] is an 38 year old gravida 2 para 0010 at 77 weeks and 2 days estimated gestational age who presents for cesarean section. Following the appropriate informed consent the patient was brought to the operating room where spinal anesthesia was administered and found to be adequate. She was placed in the dorsal supine position with a leftward tilt. She was prepped and draped in the normal sterile fashion.  The patient was appropriately identified during a preoperative timeout procedure.  The scalpel was then used to make a Pfannenstiel skin incision which was carried down to the underlying layers of soft tissue to the fascia. The fascia was incised in the midline and the fascial incision was extended laterally with Mayo scissors. The superior aspect of the fascial incision was grasped with Coker clamps x2, tented up and the rectus muscles dissected off sharply with the electrocautery unit. The same procedure was repeated on the inferior aspect of the fascial incision. The rectus muscles were separated in the midline. The abdominal peritoneum  was identified, tented up, entered sharply, and the incision was extended superiorly and inferiorly with good visualization of the bladder. The Alexis retractor was then deployed. The vesicouterine peritoneum was identified, tented up, entered sharply, and the bladder flap was created digitally.  The scalpel was then used to make a low transverse incision on the uterus which was extended laterally with blunt dissection.  Initially the anterior shoulder delivered through the hysterotomy.  This was returned to the uterine cavity and the fetal vertex was identified, elevated and delivered through the uterine incision followed by the body.  The infant was bulb suctioned and stimulated on the operative field. Following a 1 minute delay, the cord was clamped and cut. The infant was passed to the waiting neonatology team. Placenta was then delivered spontaneously and the uterus was cleared of all clot and debris. The uterine incision was repaired with #1 chromic in running locked fashion.  The midline vertical extension was repaired with #1 chromic in a running locked fashion.  The tissue of the lower uterine segment appeared fragile and tore with minimal tension on the tissue.  A second imbricating layer was not performed because the initial repair of hysterotomy was hemostatic. The ovaries and tubes were inspected and normal. The Alexis retractor was removed. The abdominal peritoneum was reapproximated with 2-0 Vicryl in a running fashion. The rectus muscles was reapproximated with 2-0 chromic in a running fashion. The fascia was closed with 0-looped PDS in a running fashion.  Subcutaneous tissue was reapproximated with 2-0 plain gut interrupted sutures.  the skin was closed with 4-0 vicryl in a subcuticular fashion and Dermabond. All laparotomy sponge, instrument, and needle counts were correct.  The patient tolerated the procedure well  and was transferred to the recovery unit in stable condition following the  procedure.

## 2024-03-06 ENCOUNTER — Encounter (HOSPITAL_COMMUNITY): Payer: Self-pay | Admitting: Obstetrics and Gynecology

## 2024-03-06 LAB — CBC
HCT: 35.3 % — ABNORMAL LOW (ref 36.0–46.0)
Hemoglobin: 11.9 g/dL — ABNORMAL LOW (ref 12.0–15.0)
MCH: 29.4 pg (ref 26.0–34.0)
MCHC: 33.7 g/dL (ref 30.0–36.0)
MCV: 87.2 fL (ref 80.0–100.0)
Platelets: 149 K/uL — ABNORMAL LOW (ref 150–400)
RBC: 4.05 MIL/uL (ref 3.87–5.11)
RDW: 13.6 % (ref 11.5–15.5)
WBC: 24.1 K/uL — ABNORMAL HIGH (ref 4.0–10.5)
nRBC: 0 % (ref 0.0–0.2)

## 2024-03-06 LAB — CREATININE, SERUM
Creatinine, Ser: 0.92 mg/dL (ref 0.44–1.00)
GFR, Estimated: >60 mL/min (ref >60)

## 2024-03-06 MED ORDER — IBUPROFEN 600 MG PO TABS
600.0000 mg | ORAL_TABLET | Freq: Four times a day (QID) | ORAL | Status: DC
  Administered 2024-03-06 – 2024-03-08 (×7): 600 mg via ORAL
  Filled 2024-03-06 (×7): qty 1

## 2024-03-06 MED ORDER — CALCIUM CARBONATE ANTACID 500 MG PO CHEW
800.0000 mg | CHEWABLE_TABLET | Freq: Once | ORAL | Status: AC
  Administered 2024-03-06: 800 mg via ORAL
  Filled 2024-03-06: qty 4

## 2024-03-06 NOTE — Progress Notes (Signed)
 Patient is doing well.   She has not been able to sleep overnight, but is tolerating PO.  Pain is controlled.  However, currently getting orthostatics and out of bed for the first time, so is now requesting a dose of oxycodone .  Lochia is appropriate  Vitals:   03/05/24 2245 03/06/24 0010 03/06/24 0124 03/06/24 0839  BP: 106/62 116/81 129/81 97/68  Pulse:      Resp: 16 16 16 18   Temp: 99.6 F (37.6 C) 98.2 F (36.8 C) 98.4 F (36.9 C) 98.1 F (36.7 C)  TempSrc: Oral  Oral Oral  SpO2: 92% 99% 95% 96%  Weight:      Height:        NAD Abdomen:  soft, appropriate tenderness, incisions intact and without erythema or drainage ext:    Symmetric, 1+ edema bilaterally  Lab Results  Component Value Date   WBC 24.1 (H) 03/06/2024   HGB 11.9 (L) 03/06/2024   HCT 35.3 (L) 03/06/2024   MCV 87.2 03/06/2024   PLT 149 (L) 03/06/2024    --/--/O POS (11/19 0145)  A/P    38 y.o. G2P1011 POD #1 s/p primary cesarean section for non-reassuring fetal status in the setting of chorioamnionitis and failure to progress past 2 cm  Routine post op and postpartum care.   Chorioamnionitis:  Has been afebrile since delivery; tachycardia has resolved.  Plan 24 hours of amp/gent post-delivery GDMA1: will check fasting BG tomorrow GHTN: developed during IOL: mild rang BPs since delivery BMI 42: lovenox  prophylaxis

## 2024-03-06 NOTE — Lactation Note (Signed)
 This note was copied from a baby's chart. Lactation Consultation Note  Patient Name: [name] Date: 03/06/2024 Age:36 hours Reason for consult: 1st time breastfeeding;Follow-up assessment;Term (infant with weight loss -0.21%, stable blood sugars,). MOB with hx GDM   P1, MOB latched infant on her right breast using the football hold position, infant latched with depth and sustained her latch, infant was still breastfeeding after 13 minutes when LC left the room. MOB will continue to breast infant by cues on demand, 8-12 times within 24 hours, skin to skin. MOB knows to call for further latch assistance if needed.   Maternal Data Does the patient have breastfeeding experience prior to this delivery?: No  Feeding Mother's Current Feeding Choice: Breast Milk and Formula Nipple Type: Nfant Standard Flow (white)  LATCH Score Latch: Grasps breast easily, tongue down, lips flanged, rhythmical sucking.  Audible Swallowing: A few with stimulation  Type of Nipple: Everted at rest and after stimulation  Comfort (Breast/Nipple): Soft / non-tender  Hold (Positioning): Assistance needed to correctly position infant at breast and maintain latch.  LATCH Score: 8   Lactation Tools Discussed/Used    Interventions Interventions: Breast feeding basics reviewed;Assisted with latch;Skin to skin;Support pillows;Adjust position;Breast compression;Position options;DEBP;Education;CDC milk storage guidelines;CDC Guidelines for Breast Pump Cleaning;Guidelines for Milk Supply and Pumping Schedule Handout;Pace feeding  Discharge    Consult Status Consult Status: Follow-up Date: 03/07/24 Follow-up type: In-patient    Grayce LULLA Batter 03/06/2024, 9:16 PM

## 2024-03-06 NOTE — Clinical Social Work Maternal (Addendum)
 CLINICAL SOCIAL WORK MATERNAL/CHILD NOTE   Patient Details  Name: [name] MRN: [id number] Date of Birth: 02/17/1986   Date:  10-06-2023   Clinical Social Worker Initiating Note:  Mahogani Holohan      Date/Time: Initiated:  03/06/24/1459      Child's Name:  Rachael Ferrie 09-19-2023    Biological Parents:  Mother, Father Lois Peters 01/18/1988, Mabel Pouch 07/03/1986)    Need for Interpreter:  None    Reason for Referral:  Current Substance Use/Substance Use During Pregnancy      Address:  [address] [place] [facility] [address]    Phone number:  [phone] (home) [phone] (work)     Additional phone number:    Household Members/Support Persons (HM/SP):   Household Member/Support Person 1     HM/SP Name Relationship DOB or Age  HM/SP -1 Delorise Hunkele FOB 07/03/1986  HM/SP -2     HM/SP -3     HM/SP -4     HM/SP -5     HM/SP -6     HM/SP -7     HM/SP -8         Natural Supports (not living in the home):       Professional Supports: None    Employment: Full-time    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    Education:  Research scientist (physical sciences)    Homebound arranged:     Surveyor, Quantity Resources:  Media Planner     Other Resources:       Cultural/Religious Considerations Which May Impact Care:     Strengths:  Ability to meet basic needs  , Home prepared for child  , Pediatrician chosen    Psychotropic Medications:          Pediatrician:    [employer] Oper[employer] area   Pediatrician List:    [facility]  [place]   [place]   [facility]       Pediatrician Fax Number:     Risk Factors/Current Problems:  None    Cognitive State:  Alert  , Able to Concentrate      Mood/Affect:  Calm  , Comfortable      CSW Assessment: CSW received a consult for hx of anxiety and depression and THC use. CSW met with MOB to complete an assessment and offer support. CSW entered the  room and observed MOB resting in bed and FOB an her mother at bedside, CSW introduced self and requested to speak to MOB alone, room guest willingly left the room. CSW inquired about how MOB was feeling, MOB reported good. CSW verified MOB's demographic information, MOB reported her current address is [address] [place], [place] [address].  CSW inquired about MOB MH hx MOB reported a stable mood during pregnancy and reported she was initially diagnosed when she was 22, MOB reported no current MH concerns. CSW provided education regarding the baby blues period vs. perinatal mood disorders, discussed treatment and gave resources for mental health follow up if concerns arise.  CSW recommends self-evaluation during the postpartum time period using the New Mom Checklist from Postpartum Progress and encouraged MOB to contact a medical professional if symptoms are noted at any time.  MOB identified her mom and FOB as her primary supports.    CSW inquired about MOB THC use,  MOB reported she stopped THC use once she found out she was pregnant. CSW informed MOB of the hospital rug screen policy  and notified her infants UDS was negative and the CDS was pending, MOB verbalized understanding.    CSW provided review of Sudden Infant Death Syndrome (SIDS) precautions. MOB identified [facility] for infants follow up care. MOB reported she has all essential items for the infant including a bassinet, crib and car seat.  CSW identifies no further need for intervention and no barriers to discharge at this time.   CSW Plan/Description:  No Further Intervention Required/No Barriers to Discharge, Sudden Infant Death Syndrome (SIDS) Education, Perinatal Mood and Anxiety Disorder (PMADs) Education, CSW Will Continue to Monitor Umbilical Cord Tissue Drug Screen Results and Make Report if [facility] Drug Screen Policy Information      Eliazar CHRISTELLA Gave, [place] 01/08/24, 3:08 PM

## 2024-03-06 NOTE — Lactation Note (Signed)
 This note was copied from a baby's chart. Lactation Consultation Note  Patient Name: [name] Date: 03/06/2024 Age:36 hours Reason for consult: Initial assessment;Primapara;Term;Maternal endocrine disorder  P1. GDM mom not feeling that great from c/section and meds. Baby's temp is low. Dad is doing STS w/warm blankets over them. Strongly encouraged mom to call for [facility] for next feeding. Discussed w/mom how to conserve baby's calories and energy levels. Mom stated she will be happy to call for me. Praised mom for having great flow of colostrum. Discussed supplementing mom's milk 1st then formula if needed.  Newborn feeding habits, behavior, STS, I&O, positioning, body alignment and support reviewed. Mom encouraged to feed baby 8-12 times/24 hours and with feeding cues.  Mom is going to rest until next feeding.  Maternal Data Has patient been taught Hand Expression?: Yes Does the patient have breastfeeding experience prior to this delivery?: No  Feeding    LATCH Score       Type of Nipple: Everted at rest and after stimulation  Comfort (Breast/Nipple): Soft / non-tender         Lactation Tools Discussed/Used    Interventions Interventions: Breast feeding basics reviewed;Hand express;Education;LC Services brochure  Discharge Discharge Education: Outpatient recommendation Pump: Hands Free (mom cozie)  Consult Status Consult Status: Follow-up Date: 03/06/24 Follow-up type: In-patient    Andreyah Natividad G 03/06/2024, 3:17 AM

## 2024-03-07 NOTE — Lactation Note (Signed)
 This note was copied from a baby's chart. Lactation Consultation Note  Patient Name: [name] Date: 03/07/2024 Age:36 hours Reason for consult: Follow-up assessment;Mother's request;Primapara;1st time breastfeeding;Term;Maternal endocrine disorder;Breastfeeding assistance;RN request  P1- RN requested latching assistance because infant had been sleepy. Infant's last feeding was 15 minutes on the breast followed by 40 mL of Neosure formula. LC assisted with placing infant on the right breast in the football hold. LC reviewed how to compress the breast for an easier and more effective latch. Infant latched with first attempt. Infant nursed for 5 minutes total. Infant was latched for 15 minutes. LC and MOB had to stimulate infant for the entire feeding in order to sustain the sucking motion. After 15 minutes, LC encouraged MOB to try again in a few hours because infant was not interested. LC encouraged MOB to call for further assistance as needed.  In 2021 MOB had a right breast lumpectomy with radioactive seed localization. Currently MOB is able to express colostrum from both breasts. Sometimes lumpectomies and radioactive seeds can affect a woman's supply even years down the line due to tissue damage. LC encouraged MOB to keep an eye on her right breast supply and to reach out to the outpatient team for support if she notices a reduction in supply.   Maternal Data Has patient been taught Hand Expression?: Yes Does the patient have breastfeeding experience prior to this delivery?: No  Feeding Mother's Current Feeding Choice: Breast Milk and Formula  LATCH Score Latch: Repeated attempts needed to sustain latch, nipple held in mouth throughout feeding, stimulation needed to elicit sucking reflex.  Audible Swallowing: None  Type of Nipple: Everted at rest and after stimulation  Comfort (Breast/Nipple): Soft / non-tender  Hold (Positioning): Full assist, staff holds infant at  breast  LATCH Score: 5   Lactation Tools Discussed/Used Pump Education: Milk Storage  Interventions Interventions: Breast feeding basics reviewed;Assisted with latch;Breast compression;Adjust position;Support pillows;Position options;Education;Pace feeding;LC Services brochure  Discharge Discharge Education: Engorgement and breast care;Warning signs for feeding baby Pump: Hands Free;Personal  Consult Status Consult Status: Follow-up Date: 03/08/24 Follow-up type: In-patient    Recardo Hoit BS, IBCLC 03/07/2024, 5:19 PM

## 2024-03-07 NOTE — Progress Notes (Signed)
 Subjective: Postpartum Day 3: Cesarean Delivery Patient reports incisional pain, tolerating PO, + flatus, and no problems voiding. Had CP yesterday - resolved today. No HA or SOB. Admits to fatigue. Bonding well with baby    Objective: Vital signs in last 24 hours: Temp:  [97.7 F (36.5 C)-98.2 F (36.8 C)] 98.2 F (36.8 C) (11/22 0508) Pulse Rate:  [61-74] 74 (11/22 0508) Resp:  [16-18] 16 (11/22 0508) BP: (106-130)/(65-70) 106/69 (11/22 0508) SpO2:  [96 %-98 %] 98 % (11/22 0508)  Physical Exam:  General: alert, cooperative, fatigued, and no distress Lochia: appropriate Uterine Fundus: firm Incision: no significant drainage DVT Evaluation: No evidence of DVT seen on physical exam. Calf/Ankle edema is present.  Recent Labs    03/06/24 0527  HGB 11.9*  HCT 35.3*    Assessment/Plan: POD#3 s/p prime c/s following NRFHTs at 2cm , chorioamnionitis dx - S/P 24hrs antibx - on lovenox  pp - Pain not well controlled - will address today and encourage ambulation - Routine pp/[post op care; likely discharge home tomorrow  Katie LELON Solo, DO 03/07/2024, 9:40 AM

## 2024-03-07 NOTE — Plan of Care (Signed)

## 2024-03-07 NOTE — Lactation Note (Signed)
 This note was copied from a baby's chart. Lactation Consultation Note  Patient Name: [name] Date: 03/07/2024 Age:36 hours Reason for consult: Follow-up assessment;Mother's request;Primapara;1st time breastfeeding;Term;Maternal endocrine disorder;Breastfeeding assistance;RN request  P1- RN requested latching assistance for infant. LC assisted with placing infant on the right breast in the football hold. Infant was able to latch after 3 attempts. Infant had flanged lips and a strong rhythmic pull. LC praised MOB and infant. LC watched infant nurse for 10 minutes and she was still nursing when [facility] left the room. LC encouraged MOB to call for further assistance as needed.  Maternal Data Has patient been taught Hand Expression?: Yes Does the patient have breastfeeding experience prior to this delivery?: No  Feeding Mother's Current Feeding Choice: Breast Milk and Formula  LATCH Score Latch: Grasps breast easily, tongue down, lips flanged, rhythmical sucking.  Audible Swallowing: A few with stimulation  Type of Nipple: Everted at rest and after stimulation  Comfort (Breast/Nipple): Soft / non-tender  Hold (Positioning): Assistance needed to correctly position infant at breast and maintain latch.  LATCH Score: 8   Lactation Tools Discussed/Used Pump Education: Milk Storage  Interventions Interventions: Breast feeding basics reviewed;Assisted with latch;Breast compression;Adjust position;Support pillows;Position options;Education;LC Services brochure  Discharge Discharge Education: Engorgement and breast care;Warning signs for feeding baby Pump: Hands Free;Personal  Consult Status Consult Status: Follow-up Date: 03/08/24 Follow-up type: In-patient    Recardo Hoit BS, IBCLC 03/07/2024, 7:39 PM

## 2024-03-07 NOTE — Lactation Note (Signed)
 This note was copied from a baby's chart. Lactation Consultation Note  Patient Name: [name] Date: 03/07/2024 Age:36 hours Reason for consult: Follow-up assessment;Mother's request;1st time breastfeeding  P1, Mother hand expressed drops before latching. Requested assistance with latching. Assisted with latching in football hold with wide gape. Intermittent swallows noted. If baby comes off after a few minutes of feeding, guide her back on the breast.   Mother states baby is fussy at the breast at times. Encouraged offering the breast first.  Reviewed supply and demand and the flow of the bottle versus the breast.  Reviewed volume guidelines. When giving a bottle slow down flow by burping half way through feeding.   Maternal Data Has patient been taught Hand Expression?: Yes Does the patient have breastfeeding experience prior to this delivery?: No  Feeding Mother's Current Feeding Choice: Breast Milk and Formula  LATCH Score Latch: Grasps breast easily, tongue down, lips flanged, rhythmical sucking.  Audible Swallowing: A few with stimulation  Type of Nipple: Everted at rest and after stimulation  Comfort (Breast/Nipple): Soft / non-tender  Hold (Positioning): Assistance needed to correctly position infant at breast and maintain latch.  LATCH Score: 8   Lactation Tools Discussed/Used    Interventions Interventions: Breast feeding basics reviewed;Assisted with latch;Skin to skin;Hand express;Education  Discharge Pump: Hands Free;Personal  Consult Status Consult Status: Follow-up Date: 03/08/24 Follow-up type: In-patient    Shannon Dines [facility] 03/07/2024, 10:48 AM

## 2024-03-08 MED ORDER — MORPHINE SULFATE (PF) 2 MG/ML IV SOLN
1.0000 mg | Freq: Once | INTRAVENOUS | Status: AC
  Administered 2024-03-08: 1 mg via SUBCUTANEOUS
  Filled 2024-03-08: qty 1

## 2024-03-08 MED ORDER — OXYCODONE-ACETAMINOPHEN 5-325 MG PO TABS
1.0000 | ORAL_TABLET | ORAL | 0 refills | Status: AC | PRN
Start: 2024-03-08 — End: 2024-03-15

## 2024-03-08 MED ORDER — IBUPROFEN 600 MG PO TABS: 600.0000 mg | ORAL_TABLET | Freq: Four times a day (QID) | ORAL | 1 refills | Status: AC | PRN

## 2024-03-08 NOTE — Discharge Instructions (Signed)
 Call office with any concerns [phone]

## 2024-03-08 NOTE — Progress Notes (Signed)
 Subjective: Postpartum Day 3: Cesarean Delivery Patient reports tolerating PO, + flatus, + BM, and no problems voiding.  She admits to severe pain this am- thinks strained when trying to get up from bed to void and had pain of 9/10 despite having had pain meds 2-3hrs prior. She reports once she was able to void and rest pain has improved. Due for another dose now though. She denies dysuria, CP, SOB, drainage from incision. Ambulating with no issues once able to get in/out of bed. Has an abdominal binder. Desires discharge to home today. Bonding well with baby - breastfeeding   Objective: Vital signs in last 24 hours: Temp:  [98 F (36.7 C)-98.1 F (36.7 C)] 98 F (36.7 C) (11/23 9386) Pulse Rate:  [72-77] 77 (11/23 0613) Resp:  [16-18] 18 (11/22 2055) BP: (119-134)/(68-87) 134/87 (11/23 0613) SpO2:  [98 %-100 %] 99 % (11/23 9386)  Physical Exam:  General: alert, cooperative, fatigued, and no distress Lochia: appropriate Uterine Fundus: firm Incision: no significant drainage DVT Evaluation: No evidence of DVT seen on physical exam. Calf/Ankle edema is present.  Recent Labs    03/06/24 0527  HGB 11.9*  HCT 35.3*    Assessment/Plan: 38yo G2 now P2002 on POD#3 s/p c/s after failed IOL due to NRFHTs and chorioamnionitis.  Pain control - will give pt a dose of morphine  x 1 now to help with acute pain. Discharge meds will be percocet and ibuprofen   Chorioamnionitis - completed 24hrs fo antibiotics pp. Afebrile  GDMA1 - plan 2hr gtt at pp visit  Discharge instructions given - 2 week incision check and 6 week pp visit advised  Ted LELON Solo, DO 03/08/2024, 11:13 AM

## 2024-03-08 NOTE — Discharge Summary (Addendum)
 Postpartum Discharge Summary   Date of Service updated      Patient Name: [name] DOB: [birth year] MRN: [id number]  Date of admission: 03/04/2024 Delivery date:03/05/2024 Delivering provider: OKEY LEADER Date of discharge: 03/08/2024  Admitting diagnosis: Gestational diabetes [O24.419] Cesarean delivery delivered [O82] Intrauterine pregnancy: [redacted]w[redacted]d     Secondary diagnosis:  Principal Problem:   Gestational diabetes Active Problems:   Cesarean delivery delivered  Additional problems: GDMA1    Discharge diagnosis: Term Pregnancy Delivered and GDM A1                                              Post partum procedures:n/a Augmentation: AROM, Pitocin , Cytotec , and IP Foley Complications: None  Hospital course: Induction of Labor With Cesarean Section   38 y.o. yo G2P1011 at [redacted]w[redacted]d was admitted to the hospital 03/04/2024 for induction of labor. Patient had a labor course significant for NRFHT remote from delivery. The patient went for cesarean section due to Non-Reassuring FHR. Delivery details are as follows: Membrane Rupture Time/Date: 10:46 PM,03/04/2024  Delivery Method:C-Section, Low Transverse Operative Delivery:N/A Details of operation can be found in separate operative Note.  Patient had a postpartum course complicated by pain. She is ambulating, tolerating a regular diet, passing flatus, and urinating well.  Patient is discharged home in stable condition on 03/08/24.      Newborn Data: Birth date:03/05/2024 Birth time:8:20 PM Gender:Female Living status:Living Apgars:5 ,8  Weight:3118 g                               Magnesium Sulfate received: No BMZ received: No Rhophylac:N/A MMR:N/A T-DaP:Given prenatally Flu: N/A RSV Vaccine received: No Transfusion:No Immunizations administered:  There is no immunization history on file for this patient.  Physical exam  Vitals:   03/07/24 0508 03/07/24 1355 03/07/24 2055 03/08/24 0613  BP: 106/69 119/68  131/73 134/87  Pulse: 74 72 72 77  Resp: 16 16 18    Temp: 98.2 F (36.8 C) 98 F (36.7 C) 98.1 F (36.7 C) 98 F (36.7 C)  TempSrc: Axillary Oral Oral   SpO2: 98% 98% 100% 99%  Weight:      Height:       General: alert, cooperative, and no distress Lochia: appropriate Uterine Fundus: firm Incision: Healing well with no significant drainage DVT Evaluation: No evidence of DVT seen on physical exam. Calf/Ankle edema is present Labs: Lab Results  Component Value Date   WBC 24.1 (H) 03/06/2024   HGB 11.9 (L) 03/06/2024   HCT 35.3 (L) 03/06/2024   MCV 87.2 03/06/2024   PLT 149 (L) 03/06/2024      Latest Ref Rng & Units 03/06/2024    5:27 AM  CMP  Creatinine 0.44 - 1.00 mg/dL 9.07    Edinburgh Score:    03/07/2024    6:39 PM  Edinburgh Postnatal Depression Scale Screening Tool  I have been able to laugh and see the funny side of things. 0  I have looked forward with enjoyment to things. 0  I have blamed myself unnecessarily when things went wrong. 2  I have been anxious or worried for no good reason. 0  I have felt scared or panicky for no good reason. 0  Things have been getting on top of me. 2  I have been so  unhappy that I have had difficulty sleeping. 0  I have felt sad or miserable. 0  I have been so unhappy that I have been crying. 0  The thought of harming myself has occurred to me. 0  Edinburgh Postnatal Depression Scale Total 4      After visit meds:  Allergies as of 03/08/2024       Reactions   Valproic Acid Hives   Bactrim  [sulfamethoxazole -trimethoprim ] Other (See Comments)   Depakote [divalproex Sodium]    Divalproex Sodium Hives        Medication List     STOP taking these medications    benzonatate  100 MG capsule Commonly known as: TESSALON    ondansetron  4 MG disintegrating tablet Commonly known as: ZOFRAN -ODT   OXcarbazepine  150 MG tablet Commonly known as: Trileptal    traZODone 100 MG tablet Commonly known as: DESYREL        TAKE these medications    ibuprofen  600 MG tablet Commonly known as: ADVIL  Take 1 tablet (600 mg total) by mouth every 6 (six) hours as needed for moderate pain (pain score 4-6) or cramping.   oxyCODONE -acetaminophen  5-325 MG tablet Commonly known as: Percocet Take 1 tablet by mouth every 4 (four) hours as needed for up to 7 days.   prenatal multivitamin Tabs tablet Take 1 tablet by mouth daily at 12 noon.   VITAMIN B-6 PO Take by mouth.         Discharge home in stable condition Infant Feeding: Breast Infant Disposition:home with mother Discharge instruction: per After Visit Summary and Postpartum booklet. Activity: Advance as tolerated. Pelvic rest for 6 weeks.  Diet: carb modified diet Anticipated Birth Control: Unsure Postpartum Appointment:6 weeks Additional Postpartum F/U: Postpartum Depression checkup, 2 hour GTT, and Incision check 2 weeks Future Appointments:No future appointments. Follow up Visit:  Follow-up Information     [facility]. Schedule an appointment as soon as possible for a visit.   Why: 2 weks for incision check and 6 weeks for postpartum visit Contact information: [address]  [address] [place] [address] [phone]                     03/08/2024 Ted LELON Solo, DO

## 2024-03-08 NOTE — Lactation Note (Addendum)
 This note was copied from a baby's chart. Lactation Consultation Note  Patient Name: [name] Unijb'd Date: 03/08/2024 Age:36 hours Reason for consult: Follow-up assessment;1st time breastfeeding;Maternal endocrine disorder;Term  P1, Lumpectomy right breast is 2021.  Baby has been primarily bottle fed and now mother's breasts are uncomfortable.  Discussed that if mother is going to bottle feed, she can pump her breasts q 3 hours for 15 min.  Encouraged offering the breast first.  Reviewed engorgement care and monitoring voids/stools.   Maternal Data Has patient been taught Hand Expression?: Yes  Feeding Mother's Current Feeding Choice: Breast Milk and Formula  Interventions Interventions: Education  Discharge Discharge Education: Engorgement and breast care;Warning signs for feeding baby Pump: Hands Free;Personal  Consult Status Consult Status: Complete Date: 03/08/24   Shannon Levorn Lemme  RN, IBCLC 03/08/2024, 12:51 PM

## 2024-03-11 ENCOUNTER — Inpatient Hospital Stay (HOSPITAL_COMMUNITY)
Admission: AD | Admit: 2024-03-11 | Discharge: 2024-03-11 | Disposition: A | Discharge status: Home / Self Care | Attending: Student | Admitting: Student

## 2024-03-11 ENCOUNTER — Encounter (HOSPITAL_COMMUNITY): Payer: Self-pay | Admitting: Student

## 2024-03-11 DIAGNOSIS — R10819 Abdominal tenderness, unspecified site: Secondary | ICD-10-CM | POA: Insufficient documentation

## 2024-03-11 DIAGNOSIS — K5900 Constipation, unspecified: Secondary | ICD-10-CM | POA: Insufficient documentation

## 2024-03-11 DIAGNOSIS — O9089 Other complications of the puerperium, not elsewhere classified: Secondary | ICD-10-CM | POA: Insufficient documentation

## 2024-03-11 DIAGNOSIS — Z98891 History of uterine scar from previous surgery: Secondary | ICD-10-CM

## 2024-03-11 DIAGNOSIS — L7682 Other postprocedural complications of skin and subcutaneous tissue: Secondary | ICD-10-CM

## 2024-03-11 MED ORDER — OXYCODONE HCL 5 MG PO TABS
10.0000 mg | ORAL_TABLET | Freq: Once | ORAL | Status: AC
  Administered 2024-03-11: 10 mg via ORAL
  Filled 2024-03-11: qty 2

## 2024-03-11 MED ORDER — ABDOMINAL BINDER/ELASTIC LARGE MISC: 1.0000 [IU] | Freq: Every day | 0 refills | Status: AC | PRN

## 2024-03-11 MED ORDER — ACETAMINOPHEN 500 MG PO TABS: 1000.0000 mg | ORAL_TABLET | Freq: Four times a day (QID) | ORAL | Status: AC | PRN

## 2024-03-11 MED ORDER — ACETAMINOPHEN 500 MG PO TABS
1000.0000 mg | ORAL_TABLET | Freq: Once | ORAL | Status: AC
  Administered 2024-03-11: 1000 mg via ORAL
  Filled 2024-03-11: qty 2

## 2024-03-11 NOTE — Discharge Instructions (Signed)
 You have constipation which is hard stools that are difficult to pass. It is important to have regular bowel movements every 1-3 days that are soft and easy to pass. Hard stools increase your risk of hemorrhoids and are very uncomfortable.   To prevent constipation you can increase the amount of fiber in your diet. Examples of foods with fiber are leafy greens, whole grain breads, oatmeal and other grains.  It is also important to drink at least eight 8oz glass of water everyday.   If you have not has a bowel movement in 4-5 days you made need to clean out your bowel.  This will have establish normal movement through your bowel.    Miralax Clean out  Take 8 capfuls of miralax in 64 oz of gatorade. You can use any fluid that appeals to you (gatorade, water, juice)  Continue to drink at least eight 8 oz glasses of water throughout the day  You can repeat with another 8 capfuls of miralax in 64 oz of gatorade if you are not having a large amount of stools  You will need to be at home and close to a bathroom for about 8 hours when you do the above as you may need to go to the bathroom frequently.   After you are cleaned out: - Start Colace100mg  twice daily - Start Miralax once daily - Start a daily fiber supplement like metamucil or citrucel - You can safely use enemas in pregnancy  - if you are having diarrhea you can reduce to Colace once a day or miralax every other day or a 1/2 capful daily.

## 2024-03-11 NOTE — MAU Provider Note (Signed)
 POSTPARTUM DAY day 6 status post C-section on 03/05/2024 discharged on 03/08/2024    S/HPI  [name] is a 38 y.o. G2P1011 patient who presents to MAU today with complaint of incisional pain rating an 8 out of 10.  Patient states she has been taking her medication at home oxycodone  along with ibuprofen  as scheduled.  She also reports she does have a honeycomb dressing in place over the incision and has not had a bowel movement x 1 week.  Patient also reports that she has been trying to do household chores as well as taking care of her baby and is currently breast-feeding.  She states she does have support at home but has been having trouble excepting it and is having difficulty sleeping.  Last medication for pain was at 1130 this a.m.  Her prenatal care is received by [facility] and she is scheduled for 2-week incision check upon discharge    O BP 116/75 (BP Location: Right Arm)   Pulse 85   Temp 98.1 F (36.7 C) (Oral)   Resp 18   Ht 4' 11 (1.499 m)   Wt 93.7 kg   SpO2 100%   BMI 41.71 kg/m  Physical Exam Vitals and nursing note reviewed. Exam conducted with a chaperone present.  Constitutional:      Appearance: Normal appearance.  HENT:     Head: Normocephalic.     Nose: Nose normal.     Mouth/Throat:     Mouth: Mucous membranes are moist.  Cardiovascular:     Rate and Rhythm: Normal rate.  Pulmonary:     Effort: Pulmonary effort is normal.  Abdominal:     General: A surgical scar is present. Bowel sounds are normal.     Palpations: Abdomen is soft.     Tenderness: There is abdominal tenderness. There is no guarding.      Comments: C/D/I with honeycomb dressing in place, no erythema , no edema and appropriately tender s/p C- Section    Musculoskeletal:        General: Normal range of motion.     Cervical back: Normal range of motion.  Skin:    General: Skin is warm.  Neurological:     Mental Status: She is alert and oriented to person, place,  and time.  Psychiatric:        Mood and Affect: Mood is anxious. Affect is tearful.        Behavior: Behavior normal. Behavior is cooperative.    MDM  MODERATE  Prenatal chart reviewed including inpatient delivery records and discharge summary Physical exam performed with the assistance of Warren Dimes RFN Surgical incision is C/D/I with honeycomb dressing in place and no evidence of drainage or erythema. Vital signs are stable afebrile  Patient reported her last dose of pain medication was at 11:30 AM Pain meds ordered ( Oxycodone  with Tylenol ) Abdominal Binder ordered Bowel regimen to be included discharge    Patient received abdominal Binder and pain medication with relief noted. Will plan for discharge at this time  Orders Placed This Encounter  Procedures   Abdominal Binder    Standing Status:   Standing    Number of Occurrences:   1    Meds ordered this encounter  Medications   oxyCODONE  (Oxy IR/ROXICODONE ) immediate release tablet 10 mg    Refill:  0   acetaminophen  (TYLENOL ) tablet 1,000 mg     A/P as described below.  Counseling and education provided and patient agreeable  with plan as described below. Verbalized understanding.    ASSESSMENT Medical screening exam complete  Incisional pain  Postpartum state  H/O cesarean section  Constipation, unspecified constipation type    PLAN  Discharge from MAU in stable condition  Follow up with [facility] Provider and encouraged patient to rest and take pain medication as scheduled  See AVS for full description of educational information and instructions provided to the patient at time of discharge   Warning signs for worsening condition that would warrant emergency follow-up discussed  Patient may return to MAU as needed  -----------------------------------------------------------------------------------------  Allergies as of 03/11/2024       Reactions   Valproic Acid Hives   Bactrim   [sulfamethoxazole -trimethoprim ] Other (See Comments)   Depakote [divalproex Sodium]    Divalproex Sodium Hives        Medication List     TAKE these medications    Abdominal Binder/Elastic Large Misc 1 Units by Does not apply route daily as needed for up to 14 days.   acetaminophen  500 MG tablet Commonly known as: TYLENOL  Take 2 tablets (1,000 mg total) by mouth every 6 (six) hours as needed for moderate pain (pain score 4-6) or mild pain (pain score 1-3).   ibuprofen  600 MG tablet Commonly known as: ADVIL  Take 1 tablet (600 mg total) by mouth every 6 (six) hours as needed for moderate pain (pain score 4-6) or cramping.   oxyCODONE -acetaminophen  5-325 MG tablet Commonly known as: Percocet Take 1 tablet by mouth every 4 (four) hours as needed for up to 7 days.   prenatal multivitamin Tabs tablet Take 1 tablet by mouth daily at 12 noon.   VITAMIN B-6 PO Take by mouth.         Olam Dalton, MSN, [facility], [facility]   This chart was dictated using voice recognition software, Dragon. Despite the best efforts of this provider to proofread and correct errors, errors may still occur which can change documentation meaning.

## 2024-03-11 NOTE — MAU Note (Signed)
 [name] is a 38 y.o. at [redacted]w[redacted]d here in MAU reporting: that she is having incisional pain that is rated 8/10.  Pt takes oxycodone  along with ibuprofen  but is not effective.  Pt reports that she has the honeycomb still on the incision. Pt also states that she has not had a BM since last Tuesday.    Onset of complaint: this morning  Pain score: 8 Vitals:   03/11/24 1540  BP: (!) 109/91  Pulse: 85  Resp: 18  Temp: 98.1 F (36.7 C)  SpO2: 100%      Lab orders placed from triage: UA

## 2024-03-13 LAB — SURGICAL PATHOLOGY

## 2024-03-17 ENCOUNTER — Telehealth (HOSPITAL_COMMUNITY): Payer: Self-pay | Admitting: *Deleted

## 2024-03-17 NOTE — Telephone Encounter (Signed)
 03/17/2024  Name: [name] MRN: [id number] DOB: 1985-09-15  Reason for Call:  Transition of Care Hospital Discharge Call  Contact Status: Patient Contact Status: Message  Language assistant needed:          Follow-Up Questions:    Van Postnatal Depression Scale:  In the Past 7 Days:    PHQ2-9 Depression Scale:     Discharge Follow-up:    Post-discharge interventions: NA  Mliss Sieve, RN 03/17/2024 11:39

## 2024-05-20 ENCOUNTER — Encounter (HOSPITAL_COMMUNITY): Payer: Self-pay | Admitting: Obstetrics and Gynecology
